# Patient Record
Sex: Male | Born: 1975 | Race: White | Hispanic: No | Marital: Single | Smoking: Current every day smoker
Health system: Southern US, Community
[De-identification: ages and names within clinical notes are randomized; demographics above are authoritative.]

## PROBLEM LIST (undated history)

## (undated) DIAGNOSIS — F32A Depression, unspecified: Secondary | ICD-10-CM

## (undated) DIAGNOSIS — F329 Major depressive disorder, single episode, unspecified: Secondary | ICD-10-CM

## (undated) DIAGNOSIS — F419 Anxiety disorder, unspecified: Secondary | ICD-10-CM

## (undated) HISTORY — PX: APPENDECTOMY: SHX54

---

## 2012-11-25 ENCOUNTER — Encounter (HOSPITAL_COMMUNITY): Payer: Self-pay | Admitting: *Deleted

## 2012-11-25 ENCOUNTER — Inpatient Hospital Stay (HOSPITAL_COMMUNITY)
Admission: AD | Admit: 2012-11-25 | Discharge: 2012-11-26 | DRG: 425 | Disposition: A | Discharge status: Home / Self Care | Payer: BC Managed Care – PPO | Source: Ambulatory Visit | Attending: Psychiatry | Admitting: Psychiatry

## 2012-11-25 ENCOUNTER — Ambulatory Visit (HOSPITAL_COMMUNITY)
Admission: RE | Admit: 2012-11-25 | Discharge: 2012-11-25 | Disposition: A | Discharge status: Home / Self Care | Payer: BC Managed Care – PPO | Attending: Psychiatry | Admitting: Psychiatry

## 2012-11-25 ENCOUNTER — Emergency Department (HOSPITAL_COMMUNITY)
Admission: EM | Admit: 2012-11-25 | Discharge: 2012-11-25 | Disposition: A | Discharge status: Other Acute Inpatient Hospital | Payer: BC Managed Care – PPO | Attending: Emergency Medicine | Admitting: Emergency Medicine

## 2012-11-25 ENCOUNTER — Encounter (HOSPITAL_COMMUNITY): Payer: Self-pay | Admitting: Behavioral Health

## 2012-11-25 DIAGNOSIS — R0789 Other chest pain: Secondary | ICD-10-CM | POA: Insufficient documentation

## 2012-11-25 DIAGNOSIS — R0602 Shortness of breath: Secondary | ICD-10-CM | POA: Insufficient documentation

## 2012-11-25 DIAGNOSIS — Z79899 Other long term (current) drug therapy: Secondary | ICD-10-CM

## 2012-11-25 DIAGNOSIS — F32A Depression, unspecified: Secondary | ICD-10-CM | POA: Diagnosis present

## 2012-11-25 DIAGNOSIS — F3289 Other specified depressive episodes: Secondary | ICD-10-CM | POA: Insufficient documentation

## 2012-11-25 DIAGNOSIS — F411 Generalized anxiety disorder: Secondary | ICD-10-CM | POA: Diagnosis present

## 2012-11-25 DIAGNOSIS — F17200 Nicotine dependence, unspecified, uncomplicated: Secondary | ICD-10-CM | POA: Diagnosis present

## 2012-11-25 DIAGNOSIS — F329 Major depressive disorder, single episode, unspecified: Secondary | ICD-10-CM | POA: Diagnosis present

## 2012-11-25 DIAGNOSIS — R45851 Suicidal ideations: Secondary | ICD-10-CM | POA: Insufficient documentation

## 2012-11-25 DIAGNOSIS — F410 Panic disorder [episodic paroxysmal anxiety] without agoraphobia: Principal | ICD-10-CM | POA: Diagnosis present

## 2012-11-25 DIAGNOSIS — F39 Unspecified mood [affective] disorder: Secondary | ICD-10-CM | POA: Insufficient documentation

## 2012-11-25 DIAGNOSIS — F419 Anxiety disorder, unspecified: Secondary | ICD-10-CM

## 2012-11-25 DIAGNOSIS — Z008 Encounter for other general examination: Secondary | ICD-10-CM | POA: Insufficient documentation

## 2012-11-25 HISTORY — DX: Major depressive disorder, single episode, unspecified: F32.9

## 2012-11-25 HISTORY — DX: Depression, unspecified: F32.A

## 2012-11-25 HISTORY — DX: Anxiety disorder, unspecified: F41.9

## 2012-11-25 LAB — COMPREHENSIVE METABOLIC PANEL
ALT: 23 U/L (ref 0–53)
AST: 24 U/L (ref 0–37)
Albumin: 4.3 g/dL (ref 3.5–5.2)
Calcium: 9.6 mg/dL (ref 8.4–10.5)
Creatinine, Ser: 0.92 mg/dL (ref 0.50–1.35)
Sodium: 140 mEq/L (ref 135–145)

## 2012-11-25 LAB — CBC
MCH: 31.5 pg (ref 26.0–34.0)
MCV: 88.8 fL (ref 78.0–100.0)
Platelets: 408 10*3/uL — ABNORMAL HIGH (ref 150–400)
RBC: 5.02 MIL/uL (ref 4.22–5.81)
RDW: 12.4 % (ref 11.5–15.5)
WBC: 13.2 10*3/uL — ABNORMAL HIGH (ref 4.0–10.5)

## 2012-11-25 LAB — RAPID URINE DRUG SCREEN, HOSP PERFORMED
Amphetamines: NOT DETECTED
Benzodiazepines: POSITIVE — AB
Cocaine: NOT DETECTED
Opiates: NOT DETECTED
Tetrahydrocannabinol: NOT DETECTED

## 2012-11-25 LAB — SALICYLATE LEVEL: Salicylate Lvl: <2 mg/dL — ABNORMAL LOW (ref 2.8–20.0)

## 2012-11-25 MED ORDER — IBUPROFEN 600 MG PO TABS: 600.0000 mg | ORAL_TABLET | Freq: Three times a day (TID) | ORAL | Status: DC | PRN

## 2012-11-25 MED ORDER — NICOTINE 21 MG/24HR TD PT24
21.0000 mg | MEDICATED_PATCH | Freq: Every day | TRANSDERMAL | Status: DC
  Administered 2012-11-26: 21 mg via TRANSDERMAL
  Filled 2012-11-25 (×3): qty 1

## 2012-11-25 MED ORDER — ACETAMINOPHEN 325 MG PO TABS: 650.0000 mg | ORAL_TABLET | Freq: Four times a day (QID) | ORAL | Status: DC | PRN

## 2012-11-25 MED ORDER — ALPRAZOLAM 0.5 MG PO TABS: 0.5000 mg | ORAL_TABLET | Freq: Three times a day (TID) | ORAL | Status: DC | PRN

## 2012-11-25 MED ORDER — FLUOXETINE HCL 20 MG PO CAPS
40.0000 mg | ORAL_CAPSULE | Freq: Every day | ORAL | Status: DC
  Filled 2012-11-25 (×4): qty 2

## 2012-11-25 MED ORDER — ACETAMINOPHEN 325 MG PO TABS: 650.0000 mg | ORAL_TABLET | ORAL | Status: DC | PRN

## 2012-11-25 MED ORDER — LORAZEPAM 1 MG PO TABS: 1.0000 mg | ORAL_TABLET | Freq: Three times a day (TID) | ORAL | Status: DC | PRN

## 2012-11-25 MED ORDER — MAGNESIUM HYDROXIDE 400 MG/5ML PO SUSP: 30.0000 mL | Freq: Every day | ORAL | Status: DC | PRN

## 2012-11-25 MED ORDER — ALUM & MAG HYDROXIDE-SIMETH 200-200-20 MG/5ML PO SUSP: 30.0000 mL | ORAL | Status: DC | PRN

## 2012-11-25 MED ORDER — HYDROXYZINE HCL 50 MG PO TABS
50.0000 mg | ORAL_TABLET | Freq: Every evening | ORAL | Status: DC | PRN
  Administered 2012-11-25: 50 mg via ORAL
  Filled 2012-11-25: qty 8
  Filled 2012-11-25: qty 1

## 2012-11-25 NOTE — ED Notes (Signed)
Patient admitted for panic attacks, on Effexor being weaned off and placed on Prozac, Prozac did not work as the Effexor had and he was having panic attacks even more than 10 years ago, just wants the panic attacks to get less and allow him to feel better, patient pleasant and cooperative

## 2012-11-25 NOTE — Tx Team (Signed)
Initial Interdisciplinary Treatment Plan  PATIENT STRENGTHS: (choose at least two) Ability for insight Active sense of humor Average or above average intelligence Capable of independent living General fund of knowledge Motivation for treatment/growth Supportive family/friends  PATIENT STRESSORS: Health problems Medication change or noncompliance   PROBLEM LIST: Problem List/Patient Goals Date to be addressed Date deferred Reason deferred Estimated date of resolution  Panic attacks      Medication adjustment-was taken off Effexor and started on Prozac, then the panic attacks started.      Risk for self harm      Grief/loss                                     DISCHARGE CRITERIA:  Ability to meet basic life and health needs Improved stabilization in mood, thinking, and/or behavior Motivation to continue treatment in a less acute level of care Verbal commitment to aftercare and medication compliance  PRELIMINARY DISCHARGE PLAN: Outpatient therapy Return to previous living arrangement Return to previous work or school arrangements  PATIENT/FAMIILY INVOLVEMENT: This treatment plan has been presented to and reviewed with the patient, John Estes, and/or family member.  The patient and family have been given the opportunity to ask questions and make suggestions.  Jesus Genera [facility] 11/25/2012, 11:55 PM

## 2012-11-25 NOTE — ED Notes (Signed)
Pt states he went to [facility] today d/t severe anxiety and recently had his medications changed, was taking effexor and got switched to prozac, states he does not think he was taken off effexor properly and the prozac is not working for him. Pt states "I have no had SI thoughts in the last 6 hours but have had SI recently". Denies plan. Pt states "I need help getting my head fixed".
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
## 2012-11-25 NOTE — ED Provider Notes (Signed)
History     CSN: [id number]  Arrival date & time 11/25/12  1506   First MD Initiated Contact with Patient 11/25/12 1610      Chief Complaint  Patient presents with  . Medical Clearance  . Anxiety    (Consider location/radiation/quality/duration/timing/severity/associated sxs/prior treatment) HPI Comments: This is a 36 year old male, who presents to the ED with a chief complaint of anxiety.  Additionally, he endorses SI, but without a plan.  He states that his medications have been changed recently, and that he is having more and more frequent panic attacks.  He states that he has also had more frequent thoughts of ending his life.  He states that this is concerning to him.  He also endorses SOB and chest tightness that come and go with his anxiety.  He has no cardiac history, however he does smoke.  The patient takes Prozac daily, and occasionally takes xanax.  The history is provided by the patient. No language interpreter was used.    Past Medical History  Diagnosis Date  . Anxiety   . Depression     Past Surgical History  Procedure Date  . Appendectomy     History reviewed. No pertinent family history.  History  Substance Use Topics  . Smoking status: Current Every Day Smoker  . Smokeless tobacco: Never Used  . Alcohol Use: No      Review of Systems  All other systems reviewed and are negative.    Allergies  Penicillins  Home Medications   Current Outpatient Rx  Name  Route  Sig  Dispense  Refill  . ALPRAZOLAM 1 MG PO TABS   Oral   Take 0.5 mg by mouth 3 (three) times daily as needed. Anxiety         . FLUOXETINE HCL 40 MG PO CAPS   Oral   Take 40 mg by mouth daily.           BP 116/71  Pulse 69  Temp 98.5 F (36.9 C) (Oral)  Resp 16  SpO2 100%  Physical Exam  Nursing note and vitals reviewed. Constitutional: He is oriented to person, place, and time. He appears well-developed and well-nourished.  HENT:  Head: Normocephalic and  atraumatic.  Right Ear: External ear normal.  Left Ear: External ear normal.  Nose: Nose normal.  Mouth/Throat: Oropharynx is clear and moist. No oropharyngeal exudate.  Eyes: Conjunctivae normal and EOM are normal. Pupils are equal, round, and reactive to light. Right eye exhibits no discharge. Left eye exhibits no discharge. No scleral icterus.  Neck: Normal range of motion. Neck supple. No JVD present.  Cardiovascular: Normal rate, regular rhythm, normal heart sounds and intact distal pulses.  Exam reveals no gallop and no friction rub.   No murmur heard. Pulmonary/Chest: Effort normal and breath sounds normal. No respiratory distress. He has no wheezes. He has no rales. He exhibits no tenderness.  Abdominal: Soft. Bowel sounds are normal. He exhibits no distension and no mass. There is no tenderness. There is no rebound and no guarding.  Musculoskeletal: Normal range of motion. He exhibits no edema and no tenderness.  Neurological: He is alert and oriented to person, place, and time. He has normal reflexes.       CN 3-12 intact  Skin: Skin is warm and dry.  Psychiatric: He has a normal mood and affect. His behavior is normal. Judgment and thought content normal.    ED Course  Procedures (including critical care time)  Labs Reviewed  ACETAMINOPHEN LEVEL  CBC  COMPREHENSIVE METABOLIC PANEL  ETHANOL  SALICYLATE LEVEL  URINE RAPID DRUG SCREEN (HOSP PERFORMED)   No results found.   No diagnosis found. Anxiety SI    MDM  35 year old male with anxiety and SI.  The patient states that he does have some chest discomfort, but it comes and goes with his anxiety. Currently he is not having any discomfort.  The pain is not reproducible, and does not change with exertion.  I believe this to be due to anxiety.  I believe the patient to be medically clear for ACT team evaluation.  I will move the patient to the psych ED, and put in temp. orders.        Roxy Horseman,  PA-C 11/25/12 1640

## 2012-11-25 NOTE — ED Notes (Signed)
Pt was changed into blue scrubs. Security wanded patient. Belongings sent home with girlfriend.

## 2012-11-25 NOTE — [facility] Counselor (Signed)
Received call from [place] (staff in the assessment office at [facility]). Sts that pt was assessed at [facility] and sent from [facility] for medical clearance.

## 2012-11-25 NOTE — ED Notes (Signed)
Bed:[facility]CON<BR> Expected date:<BR> Expected time:<BR> Means of arrival:<BR> Comments:<BR> Pt still in room

## 2012-11-26 ENCOUNTER — Encounter (HOSPITAL_COMMUNITY): Payer: Self-pay | Admitting: Psychiatry

## 2012-11-26 DIAGNOSIS — F411 Generalized anxiety disorder: Secondary | ICD-10-CM

## 2012-11-26 DIAGNOSIS — F419 Anxiety disorder, unspecified: Secondary | ICD-10-CM | POA: Diagnosis present

## 2012-11-26 DIAGNOSIS — F329 Major depressive disorder, single episode, unspecified: Secondary | ICD-10-CM | POA: Diagnosis present

## 2012-11-26 MED ORDER — FLUOXETINE HCL 20 MG PO CAPS
20.0000 mg | ORAL_CAPSULE | Freq: Every day | ORAL | Status: DC
  Administered 2012-11-26: 20 mg via ORAL
  Filled 2012-11-26 (×2): qty 4
  Filled 2012-11-26: qty 1

## 2012-11-26 MED ORDER — FLUOXETINE HCL 20 MG PO CAPS: 20.0000 mg | ORAL_CAPSULE | Freq: Every day | ORAL | Status: DC

## 2012-11-26 MED ORDER — HYDROXYZINE HCL 50 MG PO TABS: 50.0000 mg | ORAL_TABLET | Freq: Every evening | ORAL | Status: DC | PRN

## 2012-11-26 NOTE — Progress Notes (Signed)
Discharge Note: Patient eager for discharge, verbalized discharge plan and current medication. Denied SI/HI. Patient verbalized that he had all of his belongings. He left with prescription and a copy of his AVS.  Joice Lofts RN MS EdS 11/26/2012  12:43 PM
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
## 2012-11-26 NOTE — Progress Notes (Signed)
[facility] INPATIENT:  Family/Significant Other Suicide Prevention Education  Suicide Prevention Education:  Education Completed; Loura Pardon - fiance [phone]),  (name of family member/significant other) has been identified by the patient as the family member/significant other with whom the patient will be residing, and identified as the person(s) who will aid the patient in the event of a mental health crisis (suicidal ideations/suicide attempt).  With written consent from the patient, the family member/significant other has been provided the following suicide prevention education, prior to the and/or following the discharge of the patient.  The suicide prevention education provided includes the following:  Suicide risk factors  Suicide prevention and interventions  National Suicide Hotline telephone number  [facility] assessment telephone number  [facility] Emergency Assistance 911  [facility] and/or Residential Mobile Crisis Unit telephone number  Request made of family/significant other to:  Remove weapons (e.g., guns, rifles, knives), all items previously/currently identified as safety concern.    Remove drugs/medications (over-the-counter, prescriptions, illicit drugs), all items previously/currently identified as a safety concern.  The family member/significant other verbalizes understanding of the suicide prevention education information provided.  The family member/significant other agrees to remove the items of safety concern listed above.  Carmina Miller 11/26/2012, 10:13 AM
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
## 2012-11-26 NOTE — [facility] Suicide Risk Assessment (Signed)
Suicide Risk Assessment  Admission Assessment     Nursing information obtained from:  Patient Demographic factors:  Male;Caucasian;Living alone Current Mental Status:   (Pt denies) Loss Factors:  Decline in physical health Historical Factors:  NA Risk Reduction Factors:  Sense of responsibility to family;Employed;Positive social support;Positive therapeutic relationship  CLINICAL FACTORS:   Severe Anxiety and/or Agitation Panic Attacks  COGNITIVE FEATURES THAT CONTRIBUTE TO RISK: None identified   SUICIDE RISK:   Minimal: No identifiable suicidal ideation.  Patients presenting with no risk factors but with morbid ruminations; may be classified as minimal risk based on the severity of the depressive symptoms  PLAN OF CARE: Supportive approach/coping skills/CBT/reassess medications   John Estes A 11/26/2012, 12:07 PM

## 2012-11-26 NOTE — [facility] Suicide Risk Assessment (Signed)
Suicide Risk Assessment  Discharge Assessment     Demographic Factors:  Male and Caucasian  Mental Status Per Nursing Assessment::   On Admission:   (Pt denies)  Current Mental Status by Physician: in full contact with reality. Denies suicidal ideas, plans or intent. He is wanting to be D/C. He does not feel that being here will help with anxiety, but it is making it worst. Has good support with his fiancee and his sister.    Loss Factors: Loss of significant relationship  Historical Factors: Anniversary of important loss  Risk Reduction Factors:   Sense of responsibility to family, Employed, Living with another person, especially a relative and Positive social support  Continued Clinical Symptoms:  Panic Attacks Depression:   Insomnia  Cognitive Features That Contribute To Risk: None identified     Suicide Risk:  Minimal: No identifiable suicidal ideation.  Patients presenting with no risk factors but with morbid ruminations; may be classified as minimal risk based on the severity of the depressive symptoms  Discharge Diagnoses:   AXIS I:  Anxiety Disorder NOS (Panic, GAD) Depressive Disorder NOS AXIS II:  Deferred AXIS III:   Past Medical History  Diagnosis Date  . Anxiety   . Depression    AXIS IV:  None identified AXIS V:  61-70 mild symptoms  Plan Of Care/Follow-up recommendations:  Activity:  As tolerated Diet:  Regular Will follow up with Dr. Evelene Croon and will come to the [facility] IOP  Will pursue Prozac at 20 mg daily Is patient on multiple antipsychotic therapies at discharge:  No   Has Patient had three or more failed trials of antipsychotic monotherapy by history:  No  Recommended Plan for Multiple Antipsychotic Therapies: N/A   Lorieann Argueta A 11/26/2012, 12:18 PM

## 2012-11-26 NOTE — ED Provider Notes (Signed)
Medical screening examination/treatment/procedure(s) were performed by non-physician practitioner and as supervising physician I was immediately available for consultation/collaboration.  Azelie Noguera, MD 11/26/12 0025 

## 2012-11-26 NOTE — Progress Notes (Signed)
[facility] Adult Case Management Discharge Plan :  Will you be returning to the same living situation after discharge: Yes,  returning home At discharge, do you have transportation home?:Yes,  fiance to pick pt up Do you have the ability to pay for your medications:Yes,  access to meds  Release of information consent forms completed and in the chart;  Patient's signature needed at discharge.  Patient to Follow up at: Follow-up Information    Follow up with [facility] - IOP. On 12/02/2012. (Arrive at 8:45 am to start IOP on this date)    Contact information:   [address] [place], [place] [address] [phone]         Patient denies SI/HI:   Yes,  denies SI/HI    Safety Planning and Suicide Prevention discussed:  Yes,  discussed with pt and fiance  No recommendations from CSW.  No further needs voiced by pt.  Pt stable to discharge.    Carmina Miller 11/26/2012, 10:14 AM

## 2012-11-26 NOTE — Clinical Social Work Note (Signed)
[facility] LCSW Aftercare Discharge Planning Group Note  11/26/2012 8:45 AM  Participation Quality:  Did Not Attend  John Estes, LCSWA 11/26/2012 10:54 AM   

## 2012-11-28 NOTE — Progress Notes (Signed)
Patient Discharge Instructions:  Next Level Care Provider Has Access to the EMR, 11/27/12 Records provided to [facility] via [facility]/ Epic Access  Jerelene Redden, 11/28/2012, 2:07 PM

## 2012-12-02 ENCOUNTER — Other Ambulatory Visit (HOSPITAL_COMMUNITY): Payer: BC Managed Care – PPO | Attending: Psychiatry | Admitting: Psychiatry

## 2012-12-02 ENCOUNTER — Encounter (HOSPITAL_COMMUNITY): Payer: Self-pay

## 2012-12-02 DIAGNOSIS — F3289 Other specified depressive episodes: Secondary | ICD-10-CM | POA: Insufficient documentation

## 2012-12-02 DIAGNOSIS — F411 Generalized anxiety disorder: Secondary | ICD-10-CM | POA: Insufficient documentation

## 2012-12-02 DIAGNOSIS — F419 Anxiety disorder, unspecified: Secondary | ICD-10-CM

## 2012-12-02 DIAGNOSIS — F329 Major depressive disorder, single episode, unspecified: Secondary | ICD-10-CM

## 2012-12-02 NOTE — Progress Notes (Signed)
    Daily Group Progress Note  Program: IOP  Group Time: 9:00-10:30 am  Participation Level: None  Behavioral Response: none  Type of Therapy:  Process Group  Summary of Progress: Patient was not present for this group due to completing initial orientation and assessment for his first day in the group.      Group Time: 10:30 am - 12:00 pm   Participation Level:  Active  Behavioral Response: Appropriate  Type of Therapy: Psycho-education Group  Summary of Progress: Patient participated in a group on Grief and Loss that was facilitated by Theda Belfast and identified current losses and affective ways of grieving.   Carman Ching, LCSW
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
## 2012-12-03 ENCOUNTER — Encounter (HOSPITAL_COMMUNITY): Payer: BC Managed Care – PPO | Attending: Psychiatry | Admitting: Psychiatry

## 2012-12-03 DIAGNOSIS — F419 Anxiety disorder, unspecified: Secondary | ICD-10-CM

## 2012-12-03 DIAGNOSIS — F411 Generalized anxiety disorder: Secondary | ICD-10-CM | POA: Insufficient documentation

## 2012-12-03 DIAGNOSIS — F329 Major depressive disorder, single episode, unspecified: Secondary | ICD-10-CM | POA: Insufficient documentation

## 2012-12-03 DIAGNOSIS — F3289 Other specified depressive episodes: Secondary | ICD-10-CM | POA: Insufficient documentation

## 2012-12-03 NOTE — Discharge Summary (Signed)
Physician Discharge Summary Note  Patient:  John Estes is an 37 y.o., male MRN:  [id number] DOB:  1975/12/07 Patient phone:  [phone] (home)  Patient address:   [address] [place] [address],   Date of Admission:  11/25/2012 Date of Discharge: 11/26/2012  Reason for Admission: 37 Y/O male, single who endorsed a long standing history of anxiety, panic attacks, and some underlying depression. Had initial trials with Paxil, Lexapro, and finally Effexor what he took sucesfully for seven years. His father go sick with cancer, and eventually died this was 10-09-11. For the last couple of months he felt that the Effexor had quit working for him. He was experiencing increased anxiety. Coming 10/09/2023, the medication was changed. At the same time he was dealing with the anniversary of father's death and some other stressors. Coming off Effexor was very difficult. He was placed on Prozac 40 mg. His panic attacks became worst. He was trying not to use xanax as did not wanted to get addicted. He started getting more frustrated that he had to still deal with panic after all these years. He started feeling he would rather be dead. No clear suicidal ideas. In the past he felt he needed to come to a hospital when feeling anxious. His father was the main source of support. He talked him through. This time around he did not have his father what made the grieving worst. He admitted he had not proper ly grieved the death of his father. He was brought to the ED.  Discharge Diagnoses: Active Problems:  Anxiety disorder  Depressive disorder  Review of Systems  Constitutional: Negative.   HENT: Negative.   Eyes: Negative.   Respiratory: Negative.   Cardiovascular: Negative.   Gastrointestinal: Negative.   Genitourinary: Negative.   Musculoskeletal: Negative.   Skin: Negative.   Neurological: Negative.   Endo/Heme/Allergies: Negative.   Psychiatric/Behavioral: Positive for  depression. The patient is nervous/anxious.    Axis Diagnosis:   AXIS I:  Panic Attack Disorder, Generalized Anxiety Disorder, Depressive Disorder NOS AXIS II:  Deferred AXIS III:   Past Medical History  Diagnosis Date  . Anxiety   . Depression    AXIS IV:  None identified AXIS V:  51-60 moderate symptoms  Level of Care:  IOP  Hospital Course:  He was admitted and started in individual and group therapy. The Prozac was decreased to 20 mg thinking that the higher dose could have be contributing to the increased panic. He was placed back on the Xanax and encouraged to use it to control the acute panic and to prevent the development of agoraphobia.He felt much better. He felt reassured as his father used to reassure him when he was having the panic. He felt that the inpatient setting was not going to help him deal with the panic and could make things worst. He had the support of his girlfriend and his sister. They felt comfortable of him being discharged to outpatient treatment  Consults:  None  Significant Diagnostic Studies:  labs: as per the chart  Discharge Vitals:   Blood pressure 111/76, pulse 73, temperature 97.5 F (36.4 C), temperature source Oral, resp. rate 16, height 5' 9.5" (1.765 m), weight 78.019 kg (172 lb). Body mass index is 25.04 kg/(m^2). Lab Results:   No results found for this or any previous visit (from the past 72 hour(s)).  Physical Findings: AIMS: Facial and Oral Movements Muscles of Facial Expression: None, normal Lips and Perioral Area: None,  normal Jaw: None, normal Tongue: None, normal,Extremity Movements Upper (arms, wrists, hands, fingers): None, normal Lower (legs, knees, ankles, toes): None, normal, Trunk Movements Neck, shoulders, hips: None, normal, Overall Severity Severity of abnormal movements (highest score from questions above): None, normal Incapacitation due to abnormal movements: None, normal Patient's awareness of abnormal movements  (rate only patient's report): No Awareness, Dental Status Current problems with teeth and/or dentures?: No Does patient usually wear dentures?: No  CIWA:    COWS:     Psychiatric Specialty Exam: See Psychiatric Specialty Exam and Suicide Risk Assessment completed by Attending Physician prior to discharge.  Discharge destination:  Home  Is patient on multiple antipsychotic therapies at discharge:  No   Has Patient had three or more failed trials of antipsychotic monotherapy by history:  No  Recommended Plan for Multiple Antipsychotic Therapies: N/A      Medication List     As of 12/03/2012  8:15 AM    STOP taking these medications         ALPRAZolam 1 MG tablet   Commonly known as: XANAX      TAKE these medications      Indication    FLUoxetine 20 MG capsule   Commonly known as: PROZAC   Take 1 capsule (20 mg total) by mouth daily. For depression    Indication: Depression      hydrOXYzine 50 MG tablet   Commonly known as: ATARAX/VISTARIL   Take 1 tablet (50 mg total) by mouth at bedtime as needed and may repeat dose one time if needed (insomnia). For anxiety and sleep            Follow-up Information    Follow up with [facility] - IOP. On 12/02/2012. (Arrive at 8:45 am to start IOP on this date)    Contact information:   [address] [place], [place] [address] [phone]         Follow-up recommendations:  Will follow up at [facility] health intensive outpatient program (IOP). Will continue the Prozac 20 mg and use the Xanax 0.5 to 1 mg BID PRN anxiety  Comments:  Motivated to pursue outpatient treatment  Total Discharge Time:  More than 30 min  Signed: Dontavius Keim A 12/03/2012, 8:15 AM

## 2012-12-03 NOTE — Progress Notes (Signed)
    Daily Group Progress Note  Program: IOP  Group Time: 9:00-10:30 am   Participation Level: Active  Behavioral Response: Appropriate  Type of Therapy:  Process Group  Summary of Progress: Patient was active in discussions and described a long history of battling depression and panic attacks without having affective coping strategies. He described the role his dad fulfilled in helping him manage his panic attacks and how he does not have that anymore after his fathers death. He hopes to learn new ways of managing depression and anxiety.      Group Time: 10:30 am - 12:00 pm   Participation Level:  Active  Behavioral Response: Appropriate  Type of Therapy: Psycho-education Group  Summary of Progress: Patient learned about the symptoms of depression and anxiety and how to recognize them to avoid or reduce future relapses.  Carman Ching, LCSW

## 2012-12-04 ENCOUNTER — Encounter (HOSPITAL_COMMUNITY): Payer: BC Managed Care – PPO | Admitting: Psychiatry

## 2012-12-04 DIAGNOSIS — F329 Major depressive disorder, single episode, unspecified: Secondary | ICD-10-CM

## 2012-12-04 DIAGNOSIS — F419 Anxiety disorder, unspecified: Secondary | ICD-10-CM

## 2012-12-04 NOTE — Progress Notes (Signed)
    Daily Group Progress Note  Program: IOP  Group Time: 9:00-10:30 am   Participation Level: Active  Behavioral Response: Appropriate  Type of Therapy:  Process Group  Summary of Progress: Patient reports feeling "anxious" but "appreciate" of this group experience. He talked about how he feels he has pushed away healthy friendships by not receiving support and help from them when they were offering it. He struggles with trying to manage his depression on his own and is realizing he needs help. He states he has had a reduction in his depression symptoms through the support of the group and he is learning how to be transparent and express his feelings honestly.      Group Time: 10:30 am - 12:00 pm   Participation Level:  Active  Behavioral Response: Appropriate  Type of Therapy: Psycho-education Group  Summary of Progress: Patient learned about mental health support groups for continuing support following ending the program.   Carman Ching, LCSW

## 2012-12-05 ENCOUNTER — Encounter (HOSPITAL_COMMUNITY): Payer: BC Managed Care – PPO | Admitting: Psychiatry

## 2012-12-05 DIAGNOSIS — F32A Depression, unspecified: Secondary | ICD-10-CM

## 2012-12-05 DIAGNOSIS — F329 Major depressive disorder, single episode, unspecified: Secondary | ICD-10-CM

## 2012-12-05 DIAGNOSIS — F419 Anxiety disorder, unspecified: Secondary | ICD-10-CM

## 2012-12-05 NOTE — Progress Notes (Signed)
    Daily Group Progress Note  Program: IOP  Group Time: 9:00-10:30 am   Participation Level: Active  Behavioral Response: Appropriate  Type of Therapy:  Process Group  Summary of Progress: patient reports feeling "good" today and said he feels he is making progress with his depression. He explored his tendency to help others and how this causes him to avoid managing his own depression symptoms because is is busy trying to rescue others.      Group Time: 10:30 am - 12:00 pm   Participation Level:  Active  Behavioral Response: Appropriate  Type of Therapy: Psycho-education Group  Summary of Progress: Patient participated in two goodbye ceremonies to members ending the group today and practiced the skill of having healthy closure.   Carman Ching, LCSW

## 2012-12-06 ENCOUNTER — Encounter (HOSPITAL_BASED_OUTPATIENT_CLINIC_OR_DEPARTMENT_OTHER): Payer: BC Managed Care – PPO | Admitting: Psychiatry

## 2012-12-06 DIAGNOSIS — F411 Generalized anxiety disorder: Secondary | ICD-10-CM

## 2012-12-06 DIAGNOSIS — F419 Anxiety disorder, unspecified: Secondary | ICD-10-CM

## 2012-12-06 DIAGNOSIS — F329 Major depressive disorder, single episode, unspecified: Secondary | ICD-10-CM

## 2012-12-06 NOTE — Patient Instructions (Addendum)
Patient completed MH-IOP today.  Will follow up with Dr. Evelene Croon on 12-23-12 @ 4:30pm.  Patient will contact Sherron Monday, [facility] for a new patient appointment.  Encouraged support groups.  Return back to work (without any restrictions) on 12-09-12.

## 2012-12-06 NOTE — Progress Notes (Signed)
Patient ID: John Estes, male   DOB: [birth year], 37 y.o.   MRN: [id number] This is a discharge on this patient  whohas been at this IOP program for approximately one week. He's done very well. He was initially admitted to the inpatient unit for about a day or 2 because of the tense anxiety and some suicidal ideation. Interview today he denied being suicidal at this timel. He was believe that his anxiety was related to some grieving responses to his father who died October 18, 2011. It also may be related to withdrawal phenomena from Effexor which he was just tapered off of. He was however on 40 mg of Prozac nonetheless he seemed to have uncontrollable anxiety. Now he is on 20 mg of Prozac and takes Xanax 1 mg when necessary. He is now off all Effexor. He says his anxiety is dramatically improved. He does not feel depressed. He claims he sleeps and eats well. He is not suicidal. His thoughts are clear organizing connectived He felt he made a good connection over the week in the IOP program and did allow himself to be open. The patient feels ready to be discharged and plans to spend a day caring for his sister who is not well. Overall the patient has shown a distinct improvement in being able to talk and explore some feelings in a group setting. He's had no anxiety attacks.  Mental status exam This patient is alert and oriented. He is able to focus and concentrate. He denies anxiety or depression. There is no evidence of psychosis now or ever. The patient is low longer suicidal. Is not having acute attacks of anxiety for over a week.    Plan  At this time the patient will continue taking Prozac 20 mg every morning and will take Xanax 1 mg when necessary. The patient is very cautious about misusing the Xanax. He looks were to return in the care of his psychiatrist and to start in therapy working on his trauma from the death of his father.

## 2012-12-06 NOTE — Progress Notes (Signed)
Patient ID: John Estes, male   DOB: [birth year], 37 y.o.   MRN: [id number] D:  This is a 37 yo caucasian male who was referred per Dr. Dub Mikes. He has a long standing history since he was age 10. 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 He experiences panic attacks. More like a tightness in the chest, shooting pain on the left side, ( felt like a panic attack). Hx of SI, currently denies. Also denies any HI or A/V hallucinations. He was initially on Paxil then Lexapro and finally Effexor for 7 years. Did well on Effexor. It seemed to stop working. In 2013-11-19he started going off. He experienced withdrawal from Effexor he was placed on Prozac. Stressors: 1)Father died 11-19-12due to cancer. Melanoma metastatic to liver and brain. A year later started feeling more anxiety. Feels he never dealt with his fathers death. Medication gave him 8 more weeks. He stayed with his father for the last 5 months of his life. He was his caretaker. He was there when he died. His father was married 4 times. His father help him through his anxiety, he was wanting to be there for him. 2)History of eating disorder since age 7. 3) Strained relationship with mother.  Pt requested that today be his last day, because he has to return to work on 12-09-12.  Pt c/o having difficulty waking up.  Denies any SI/HI or A/V hallucinations.  States he would like to continue working on self care.  A:  D/C today.  F/U with Dr. Evelene Croon on 12-23-12 @ 4:30pm.  Even though Fred May, [facility] can't see pt until the first week in February, pt is still requesting to see him.  Will inform Fred May, LPC.  RTW on 12-09-12, without any restrictions.  R:  Pt receptive.

## 2012-12-06 NOTE — Progress Notes (Signed)
    Daily Group Progress Note  Program: IOP  Group Time: 9:00-10:30 am   Participation Level: Active  Behavioral Response: Appropriate  Type of Therapy:  Process Group  Summary of Progress: Today is patients last day in the group. He reports feeling more hopeful and less depressed than when he started and identified how his tendency to try to rescue and help others took away from him having his needs met.      Group Time: 10:30 am - 12:00 pm   Participation Level:  Active  Behavioral Response: Appropriate  Type of Therapy: Psycho-education Group  Summary of Progress: Patient learned about the importance of planning for daily wellness by creating a daily wellness routine list and identifying self-care items to use in addition to this list when stress increases.  Carman Ching, LCSW

## 2012-12-09 ENCOUNTER — Encounter (HOSPITAL_COMMUNITY): Payer: BC Managed Care – PPO

## 2012-12-10 ENCOUNTER — Encounter (HOSPITAL_COMMUNITY): Payer: BC Managed Care – PPO

## 2012-12-11 ENCOUNTER — Encounter (HOSPITAL_COMMUNITY): Payer: BC Managed Care – PPO

## 2012-12-12 ENCOUNTER — Encounter (HOSPITAL_COMMUNITY): Payer: BC Managed Care – PPO

## 2012-12-13 ENCOUNTER — Encounter (HOSPITAL_COMMUNITY): Payer: BC Managed Care – PPO

## 2012-12-16 ENCOUNTER — Encounter (HOSPITAL_COMMUNITY): Payer: BC Managed Care – PPO

## 2012-12-17 ENCOUNTER — Encounter (HOSPITAL_COMMUNITY): Payer: BC Managed Care – PPO

## 2012-12-18 ENCOUNTER — Encounter (HOSPITAL_COMMUNITY): Payer: BC Managed Care – PPO

## 2012-12-19 ENCOUNTER — Encounter (HOSPITAL_COMMUNITY): Payer: BC Managed Care – PPO

## 2012-12-20 ENCOUNTER — Encounter (HOSPITAL_COMMUNITY): Payer: BC Managed Care – PPO

## 2012-12-23 ENCOUNTER — Encounter (HOSPITAL_COMMUNITY): Payer: BC Managed Care – PPO

## 2012-12-24 ENCOUNTER — Encounter (HOSPITAL_COMMUNITY): Payer: BC Managed Care – PPO

## 2012-12-25 ENCOUNTER — Encounter (HOSPITAL_COMMUNITY): Payer: BC Managed Care – PPO

## 2012-12-26 ENCOUNTER — Encounter (HOSPITAL_COMMUNITY): Payer: BC Managed Care – PPO

## 2012-12-27 ENCOUNTER — Encounter (HOSPITAL_COMMUNITY): Payer: BC Managed Care – PPO

## 2012-12-30 ENCOUNTER — Encounter (HOSPITAL_COMMUNITY): Payer: BC Managed Care – PPO

## 2017-06-19 ENCOUNTER — Encounter (HOSPITAL_COMMUNITY): Payer: Self-pay | Admitting: Emergency Medicine

## 2017-06-19 ENCOUNTER — Emergency Department (HOSPITAL_COMMUNITY)
Admission: EM | Admit: 2017-06-19 | Discharge: 2017-06-19 | Disposition: A | Discharge status: Home / Self Care | Payer: 59 | Attending: Emergency Medicine | Admitting: Emergency Medicine

## 2017-06-19 DIAGNOSIS — F419 Anxiety disorder, unspecified: Secondary | ICD-10-CM

## 2017-06-19 DIAGNOSIS — F17210 Nicotine dependence, cigarettes, uncomplicated: Secondary | ICD-10-CM | POA: Insufficient documentation

## 2017-06-19 LAB — I-STAT TROPONIN, ED: TROPONIN I, POC: 0 ng/mL (ref 0.00–0.08)

## 2017-06-19 MED ORDER — LORAZEPAM 1 MG PO TABS
1.0000 mg | ORAL_TABLET | Freq: Once | ORAL | Status: AC
  Administered 2017-06-19: 1 mg via ORAL
  Filled 2017-06-19: qty 1

## 2017-06-19 NOTE — ED Triage Notes (Addendum)
Pt complaint of anxiety attack Saturday, Sunday, and again today. [facility] "they said it was acid reflux and kicked me out the door." Pt continues to verbalize "I know it's my anxiety; it is not a heart problem." Anxiety unrelieved by home medications.

## 2017-06-19 NOTE — ED Provider Notes (Signed)
[facility]-EMERGENCY DEPT Provider Note   CSN: [id number] Arrival date & time: 06/19/17  1724     History   Chief Complaint Chief Complaint  Patient presents with  . Anxiety    HPI John Estes is a 41 y.o. male.  HPI  Patient, with a past medical history of anxiety and depression, presents to the ED for several episodes of anxiety attacks over the past 3 days. He states that he is in the emergency room earlier today and was discharged with diagnosis of heartburn. He states that he has been having midepigastric pain that he relates to his anxiety. He reports similar symptoms during previous anxiety attacks. He reports 1.5 mg of his home Xanax has improved symptoms mildly. He denies any SI, HI, auditory or visual hallucinations. He denies any drug use or medication overdose.  Past Medical History:  Diagnosis Date  . Anxiety   . Depression     Patient Active Problem List   Diagnosis Date Noted  . Anxiety disorder 11/26/2012  . Depressive disorder 11/26/2012    Past Surgical History:  Procedure Laterality Date  . APPENDECTOMY         Home Medications    Prior to Admission medications   Medication Sig Start Date End Date Taking? Authorizing Provider  ALPRAZolam Prudy Feeler) 1 MG tablet Take 1-2 tablets by mouth 2 (two) times daily as needed for anxiety.  05/18/17  Yes [provider]  sertraline (ZOLOFT) 100 MG tablet Take 1 tablet by mouth daily. 05/28/17  Yes [provider]  FLUoxetine (PROZAC) 20 MG capsule Take 1 capsule (20 mg total) by mouth daily. For depression Patient not taking: Reported on 06/19/2017 11/26/12   Armandina Stammer I, NP  hydrOXYzine (ATARAX/VISTARIL) 50 MG tablet Take 1 tablet (50 mg total) by mouth at bedtime as needed and may repeat dose one time if needed (insomnia). For anxiety and sleep Patient not taking: Reported on 06/19/2017 11/26/12   Sanjuana Kava, NP    Family History Family History  Problem Relation Age of Onset  . Depression  Sister   . Alcohol abuse Maternal Grandfather     Social History Social History  Substance Use Topics  . Smoking status: Current Every Day Smoker    Packs/day: 1.00    Types: Cigarettes  . Smokeless tobacco: Never Used  . Alcohol use No     Allergies   Penicillins   Review of Systems Review of Systems  Constitutional: Negative for appetite change, chills and fever.  HENT: Negative for ear pain, rhinorrhea, sneezing and sore throat.   Eyes: Negative for photophobia and visual disturbance.  Respiratory: Negative for cough, chest tightness, shortness of breath and wheezing.   Cardiovascular: Positive for chest pain. Negative for palpitations.  Gastrointestinal: Negative for abdominal pain, blood in stool, constipation, diarrhea, nausea and vomiting.  Genitourinary: Negative for dysuria, hematuria and urgency.  Musculoskeletal: Negative for myalgias.  Skin: Negative for rash.  Neurological: Negative for dizziness, weakness and light-headedness.  Psychiatric/Behavioral: Negative for suicidal ideas. The patient is nervous/anxious.      Physical Exam Updated Vital Signs BP (!) 143/86 (BP Location: Right Arm)   Pulse (!) 57   Temp 98.1 F (36.7 C) (Oral)   Resp 20   Ht 5\' 10"  (1.778 m)   Wt 99.8 kg (220 lb)   SpO2 95%   BMI 31.57 kg/m   Physical Exam  Constitutional: He appears well-developed and well-nourished. No distress.  HENT:  Head: Normocephalic and atraumatic.  Nose:  Nose normal.  Eyes: Conjunctivae and EOM are normal. Left eye exhibits no discharge. No scleral icterus.  Neck: Normal range of motion. Neck supple.  Cardiovascular: Normal rate, regular rhythm, normal heart sounds and intact distal pulses.  Exam reveals no gallop and no friction rub.   No murmur heard. Pulmonary/Chest: Effort normal and breath sounds normal. No respiratory distress.  Abdominal: Soft. Bowel sounds are normal. He exhibits no distension. There is no tenderness. There is no  guarding.  Musculoskeletal: Normal range of motion. He exhibits no edema.  Neurological: He is alert. He exhibits normal muscle tone. Coordination normal.  Skin: Skin is warm and dry. No rash noted.  Psychiatric: He has a normal mood and affect.  Nursing note and vitals reviewed.    ED Treatments / Results  Labs (all labs ordered are listed, but only abnormal results are displayed) Labs Reviewed  I-STAT TROPONIN, ED    EKG  EKG Interpretation None       Radiology No results found.  Procedures Procedures (including critical care time)  Medications Ordered in ED Medications  LORazepam (ATIVAN) tablet 1 mg (1 mg Oral Given 06/19/17 2053)     Initial Impression / Assessment and Plan / ED Course  I have reviewed the triage vital signs and the nursing notes.  Pertinent labs & imaging results that were available during my care of the patient were reviewed by me and considered in my medical decision making (see chart for details).     Patient presents to ED for evaluation of anxiety for the past 3 days. He states that he has been having midsternal/epigastric chest pain that comes and goes due to his anxiety. He states that this feels similar to previous anxiety attacks. He is unsure if any life or family changes in his household is what's causing the anxiety. He was seen at the ED earlier today and was given negative cardiac workup, and was told he had heartburn. States he had chest x-ray, EKG and labs done.He states that despite the use of his 1.5 mg of Xanax he is continuing to have symptoms. He denies any SI, HI, AH, VH. He denies any medication overdose or access to firearms. On physical exam there are no focal findings on my exam. Troponin negative 1 here. EKG unremarkable. He is not in respiratory distress and is satting at 95% on room air. Heart rate normal. Patient given Ativan here in the ED with improvement in his symptoms. He states that he has had improvement in his  anxiety symptoms. He states that he will follow-up with his outpatient clinic for further evaluation. He states normal follow-up with them every few months. Patient appears stable for discharge at this time. Strict return precautions given.  Final Clinical Impressions(s) / ED Diagnoses   Final diagnoses:  Anxiety    New Prescriptions Discharge Medication List as of 06/19/2017  9:41 PM       Dietrich PatesKhatri, Ranger Petrich, PA-C 06/19/17 2200    Tegeler, Canary Brimhristopher J, MD 06/20/17 [phone]

## 2017-06-19 NOTE — Discharge Instructions (Signed)
Take home medications as previously prescribed. All up with outpatient clinic as needed. Return to ED for worsening chest pain, trouble breathing, coughing up blood, numbness or weakness.

## 2017-06-20 ENCOUNTER — Encounter (HOSPITAL_COMMUNITY): Payer: Self-pay | Admitting: *Deleted

## 2017-06-20 ENCOUNTER — Emergency Department (HOSPITAL_COMMUNITY): Payer: 59

## 2017-06-20 ENCOUNTER — Emergency Department (HOSPITAL_COMMUNITY)
Admission: EM | Admit: 2017-06-20 | Discharge: 2017-06-20 | Disposition: A | Discharge status: Home / Self Care | Payer: 59 | Source: Home / Self Care | Attending: Emergency Medicine | Admitting: Emergency Medicine

## 2017-06-20 ENCOUNTER — Emergency Department (HOSPITAL_COMMUNITY)
Admission: EM | Admit: 2017-06-20 | Discharge: 2017-06-20 | Disposition: A | Discharge status: Home / Self Care | Payer: 59 | Attending: Physician Assistant | Admitting: Physician Assistant

## 2017-06-20 DIAGNOSIS — Z79899 Other long term (current) drug therapy: Secondary | ICD-10-CM

## 2017-06-20 DIAGNOSIS — F17210 Nicotine dependence, cigarettes, uncomplicated: Secondary | ICD-10-CM | POA: Insufficient documentation

## 2017-06-20 DIAGNOSIS — R0789 Other chest pain: Secondary | ICD-10-CM

## 2017-06-20 DIAGNOSIS — R079 Chest pain, unspecified: Secondary | ICD-10-CM | POA: Diagnosis present

## 2017-06-20 DIAGNOSIS — Z720 Tobacco use: Secondary | ICD-10-CM | POA: Insufficient documentation

## 2017-06-20 DIAGNOSIS — F410 Panic disorder [episodic paroxysmal anxiety] without agoraphobia: Secondary | ICD-10-CM | POA: Insufficient documentation

## 2017-06-20 DIAGNOSIS — F329 Major depressive disorder, single episode, unspecified: Secondary | ICD-10-CM | POA: Insufficient documentation

## 2017-06-20 DIAGNOSIS — R200 Anesthesia of skin: Secondary | ICD-10-CM | POA: Insufficient documentation

## 2017-06-20 LAB — RAPID URINE DRUG SCREEN, HOSP PERFORMED
Amphetamines: NOT DETECTED
Barbiturates: NOT DETECTED
Benzodiazepines: POSITIVE — AB
COCAINE: NOT DETECTED
OPIATES: NOT DETECTED
Tetrahydrocannabinol: NOT DETECTED

## 2017-06-20 LAB — COMPREHENSIVE METABOLIC PANEL
ALT: 69 U/L — ABNORMAL HIGH (ref 17–63)
ANION GAP: 13 (ref 5–15)
AST: 47 U/L — ABNORMAL HIGH (ref 15–41)
Albumin: 4.4 g/dL (ref 3.5–5.0)
Alkaline Phosphatase: 71 U/L (ref 38–126)
BUN: 10 mg/dL (ref 6–20)
CALCIUM: 9.5 mg/dL (ref 8.9–10.3)
CO2: 21 mmol/L — AB (ref 22–32)
Chloride: 105 mmol/L (ref 101–111)
Creatinine, Ser: 1.17 mg/dL (ref 0.61–1.24)
Glucose, Bld: 95 mg/dL (ref 65–99)
Potassium: 4.1 mmol/L (ref 3.5–5.1)
SODIUM: 139 mmol/L (ref 135–145)
TOTAL PROTEIN: 7.6 g/dL (ref 6.5–8.1)
Total Bilirubin: 1.1 mg/dL (ref 0.3–1.2)

## 2017-06-20 LAB — CBC
HCT: 48.4 % (ref 39.0–52.0)
HEMATOCRIT: 47.2 % (ref 39.0–52.0)
HEMOGLOBIN: 16.7 g/dL (ref 13.0–17.0)
Hemoglobin: 16.8 g/dL (ref 13.0–17.0)
MCH: 31.7 pg (ref 26.0–34.0)
MCH: 32.6 pg (ref 26.0–34.0)
MCHC: 34.5 g/dL (ref 30.0–36.0)
MCHC: 35.6 g/dL (ref 30.0–36.0)
MCV: 91.8 fL (ref 78.0–100.0)
MCV: 91.7 fL (ref 78.0–100.0)
PLATELETS: 286 10*3/uL (ref 150–400)
PLATELETS: 281 10*3/uL (ref 150–400)
RBC: 5.27 MIL/uL (ref 4.22–5.81)
RBC: 5.15 MIL/uL (ref 4.22–5.81)
RDW: 12.5 % (ref 11.5–15.5)
RDW: 12.4 % (ref 11.5–15.5)
WBC: 13.7 10*3/uL — ABNORMAL HIGH (ref 4.0–10.5)
WBC: 11.4 10*3/uL — ABNORMAL HIGH (ref 4.0–10.5)

## 2017-06-20 LAB — SALICYLATE LEVEL

## 2017-06-20 LAB — BASIC METABOLIC PANEL
ANION GAP: 12 (ref 5–15)
BUN: 12 mg/dL (ref 6–20)
CALCIUM: 9.7 mg/dL (ref 8.9–10.3)
CO2: 23 mmol/L (ref 22–32)
Chloride: 103 mmol/L (ref 101–111)
Creatinine, Ser: 1.15 mg/dL (ref 0.61–1.24)
GFR calc non Af Amer: >60 mL/min (ref >60)
GLUCOSE: 79 mg/dL (ref 65–99)
POTASSIUM: 4 mmol/L (ref 3.5–5.1)
Sodium: 138 mmol/L (ref 135–145)

## 2017-06-20 LAB — ACETAMINOPHEN LEVEL

## 2017-06-20 LAB — ETHANOL

## 2017-06-20 LAB — I-STAT TROPONIN, ED: Troponin i, poc: 0 ng/mL (ref 0.00–0.08)

## 2017-06-20 MED ORDER — ACETAMINOPHEN 325 MG PO TABS: 650.0000 mg | ORAL_TABLET | Freq: Once | ORAL | Status: DC

## 2017-06-20 MED ORDER — HYDROXYZINE HCL 50 MG PO TABS: 50.0000 mg | ORAL_TABLET | Freq: Every evening | ORAL | 0 refills | Status: DC | PRN

## 2017-06-20 NOTE — BH Assessment (Signed)
Tele Assessment Note   John Estes is an 41 y.o. male. Pt reports severe anxiety and panic attacks. Pt states he is seen by a psychiatrist every 6 months for medication. The Pt is prescribed Zoloft and Xanax. Pt does not feel his medication is effective at this time. The Pt does not have a therapist at this time. Pt [facility]. Pt denies current stressors or triggers for his anxiety and panic attacks. Pt is reporting multiple panic attacks. Pt denies SA and abuse.  Per Jacki ConesLaurie, NP Pt does not meet inpatient criteria. TTS provided outpatient resources.   Diagnosis:  F41.0 Panic disorder  Past Medical History:  Past Medical History:  Diagnosis Date  . Anxiety   . Depression     Past Surgical History:  Procedure Laterality Date  . APPENDECTOMY      Family History:  Family History  Problem Relation Age of Onset  . Depression Sister   . Alcohol abuse Maternal Grandfather     Social History:  reports that he has been smoking Cigarettes.  He has been smoking about 1.00 pack per day. He has never used smokeless tobacco. He reports that he does not drink alcohol or use drugs.  Additional Social History:  Alcohol / Drug Use Pain Medications: please see mar Prescriptions: please see mar Over the Counter: please see mar  CIWA: CIWA-Ar BP: (!) 150/101 Pulse Rate: 71 COWS:    PATIENT STRENGTHS: (choose at least two) Average or above average intelligence Communication skills  Allergies:  Allergies  Allergen Reactions  . Penicillins Anaphylaxis    Home Medications:  (Not in a hospital admission)  OB/GYN Status:  No LMP for male patient.  General Assessment Data [facility] T[facility] Assessment: In system Is this a Tele or Face-to-Face Assessment?: Tele Assessment Is this an Initial Assessment or a Re-assessment for this encounter?: Initial Assessment Marital status: Single Maiden name: NA Is patient pregnant?:  No Pregnancy Status: No Living Arrangements: Spouse/significant other Can pt return to current living arrangement?: Yes Admission Status: Voluntary Is patient capable of signing voluntary admission?: Yes Referral Source: Self/Family/Friend Insurance type: united     Crisis Care Plan Living Arrangements: Spouse/significant other Legal Guardian: Other: (self) Name of Psychiatrist: NA Name of Therapist: NA  Education Status Is patient currently in school?: No Current Grade: NA Highest grade of school patient has completed: some college Name of school: NA Contact person: NA  Risk to self with the past 6 months Suicidal Ideation: No-Not Currently/Within Last 6 Months Has patient been a risk to self within the past 6 months prior to admission? : No Suicidal Intent: No Has patient had any suicidal intent within the past 6 months prior to admission? : No Is patient at risk for suicide?: No Suicidal Plan?: No Has patient had any suicidal plan within the past 6 months prior to admission? : No Access to Means: No What has been your use of drugs/alcohol within the last 12 months?: NA Previous Attempts/Gestures: No How many times?: 0 Other Self Harm Risks: NA Triggers for Past Attempts: Unpredictable Intentional Self Injurious Behavior: None Family Suicide History: No Recent stressful life event(s): Other (Comment) (unknown) Persecutory voices/beliefs?: No Depression: Yes Depression Symptoms: Loss of interest in usual pleasures, Feeling worthless/self pity Substance abuse history and/or treatment for substance abuse?: No Suicide prevention information given to non-admitted patients: Not applicable  Risk to Others within the past 6 months Homicidal Ideation: No Does patient  have any lifetime risk of violence toward others beyond the six months prior to admission? : No Thoughts of Harm to Others: No Current Homicidal Intent: No Current Homicidal Plan: No Access to Homicidal  Means: No Identified Victim: NA History of harm to others?: No Assessment of Violence: None Noted Violent Behavior Description: NA Does patient have access to weapons?: No Criminal Charges Pending?: No Does patient have a court date: No Is patient on probation?: No  Psychosis Hallucinations: None noted Delusions: None noted  Mental Status Report Appearance/Hygiene: Unremarkable Eye Contact: Fair Motor Activity: Unremarkable Speech: Logical/coherent Level of Consciousness: Alert Mood: Depressed Affect: Depressed Anxiety Level: Moderate Thought Processes: Coherent, Relevant Judgement: Unimpaired Orientation: Person, Place, Time, Situation Obsessive Compulsive Thoughts/Behaviors: None  Cognitive Functioning Concentration: Normal Memory: Recent Intact, Remote Intact IQ: Average Insight: Fair Impulse Control: Fair Appetite: Fair Weight Loss: 0 Weight Gain: 0 Sleep: No Change Total Hours of Sleep: 8 Vegetative Symptoms: None  ADLScreening [facility]) Patient's cognitive ability adequate to safely complete daily activities?: Yes Patient able to express need for assistance with ADLs?: Yes Independently performs ADLs?: Yes (appropriate for developmental age)  Prior Inpatient Therapy Prior Inpatient Therapy: Yes Prior Therapy Dates: 2013 [facility] Reason for Treatment: anxiety  Prior Outpatient Therapy Prior Outpatient Therapy: Yes Prior Therapy Dates: current Prior Therapy [facility] Reason for Treatment: anxiety  Does patient have an ACCT team?: No Does patient have Intensive In-House Services?  : No Does patient have [facility] services? : No Does patient have P4CC services?: No  ADL Screening (condition at time of admission) Patient's cognitive ability adequate to safely complete daily activities?: Yes Is the patient deaf or have difficulty hearing?: No Does the patient have difficulty  concentrating, remembering, or making decisions?: No Patient able to express need for assistance with ADLs?: Yes Does the patient have difficulty dressing or bathing?: No Independently performs ADLs?: Yes (appropriate for developmental age) Does the patient have difficulty walking or climbing stairs?: No Weakness of Legs: None Weakness of Arms/Hands: None       Abuse/Neglect Assessment (Assessment to be complete while patient is alone) Physical Abuse: Denies Verbal Abuse: Denies Sexual Abuse: Denies Exploitation of patient/patient's resources: Denies Self-Neglect: Denies     Merchant navy officerAdvance Directives (For Healthcare) Does Patient Have a Medical Advance Directive?: No    Additional Information 1:1 In Past 12 Months?: No CIRT Risk: No Elopement Risk: No Does patient have medical clearance?: Yes     Disposition:  Disposition Initial Assessment Completed for this Encounter: Yes Disposition of Patient: Outpatient treatment Type of outpatient treatment: Adult  Kallista Pae D 06/20/2017 11:03 AM

## 2017-06-20 NOTE — Discharge Instructions (Signed)
Follow-up with your primary care doctor and a therapist as soon as possible for further evaluation and treatment.  Use the resource guide given to you to help you find a therapist.

## 2017-06-20 NOTE — ED Notes (Signed)
Telepsych machine put in room.

## 2017-06-20 NOTE — [facility] Counselor (Signed)
Per Jacki ConesLaurie, NP Pt does not meet inpatient criteria. Recommends outpatient resources. Outpatient resources faxed to [phone].  Wolfgang PhoenixBrandi Dallis Czaja, [facility] Triage Specialist

## 2017-06-20 NOTE — ED Notes (Signed)
[facility].

## 2017-06-20 NOTE — ED Triage Notes (Addendum)
[facility].  States ativan helped, but anxiety remains.  Was told by [facility] to come here to be medically cleared so they could accept him.  PT states this am he wanted to die b/c he's so tired of dealing with anxiety.  However, he has no specific plan to hurt himself.

## 2017-06-20 NOTE — ED Triage Notes (Signed)
Pt reports chest pain that started this afternoon with left arm numbness. States it feels like his anxiety attacks, which have become more severe and more frequent recently. ekg done and no acute distress is noted at triage.

## 2017-06-20 NOTE — ED Notes (Signed)
Pt is being discharged. Outpt resources were given to pt and pt verbalized understanding at this time.

## 2017-06-20 NOTE — ED Provider Notes (Signed)
[facility] DEPT Provider Note   CSN: [id number] Arrival date & time: 06/20/17  [id number]     History   Chief Complaint Chief Complaint  Patient presents with  . Anxiety  . Suicidal    HPI John Estes is a 41 y.o. male.  Patient is here to get assistance with panic attacks.  He is having daily panic attacks, back to back for 5 days.  He has been evaluated at multiple ERs for the same problem, taking his usual medications, all without relief.  He has not been able to contact or see his psychiatrist, and does not currently have a therapist.  He is concerned that he will not be able to see a therapist if he leaves here today.  He denies suicidal or homicidal ideation.  He continues to work.  He denies home stressors drug use or alcoholism.  His panic attacks are characterized by "fever and chest pain."  No recent fever, chills, cough, shortness of breath nausea or vomiting.  There are no other known modifying factors.   HPI  Past Medical History:  Diagnosis Date  . Anxiety   . Depression     Patient Active Problem List   Diagnosis Date Noted  . Anxiety disorder 11/26/2012  . Depressive disorder 11/26/2012    Past Surgical History:  Procedure Laterality Date  . APPENDECTOMY         Home Medications    Prior to Admission medications   Medication Sig Start Date End Date Taking? Authorizing Provider  ALPRAZolam Prudy Feeler(XANAX) 1 MG tablet Take 1-2 tablets by mouth 2 (two) times daily as needed for anxiety.  05/18/17  Yes [provider]  sertraline (ZOLOFT) 100 MG tablet Take 1 tablet by mouth daily. 05/28/17  Yes [provider]  FLUoxetine (PROZAC) 20 MG capsule Take 1 capsule (20 mg total) by mouth daily. For depression Patient not taking: Reported on 06/19/2017 11/26/12   Armandina StammerNwoko, Agnes I, NP  hydrOXYzine (ATARAX/VISTARIL) 50 MG tablet Take 1 tablet (50 mg total) by mouth at bedtime as needed and may repeat dose one time if needed (insomnia). For anxiety and  sleep Patient not taking: Reported on 06/19/2017 11/26/12   Sanjuana KavaNwoko, Agnes I, NP    Family History Family History  Problem Relation Age of Onset  . Depression Sister   . Alcohol abuse Maternal Grandfather     Social History Social History  Substance Use Topics  . Smoking status: Current Every Day Smoker    Packs/day: 1.00    Types: Cigarettes  . Smokeless tobacco: Never Used  . Alcohol use No     Allergies   Penicillins   Review of Systems Review of Systems  All other systems reviewed and are negative.    Physical Exam Updated Vital Signs BP (!) 150/101 (BP Location: Right Arm)   Pulse 71   Temp 98.2 F (36.8 C) (Oral)   Resp 16   Ht 5\' 10"  (1.778 m)   Wt 99.8 kg (220 lb)   SpO2 95%   BMI 31.57 kg/m   Physical Exam  Constitutional: He is oriented to person, place, and time. He appears well-developed and well-nourished. No distress.  HENT:  Head: Normocephalic and atraumatic.  Right Ear: External ear normal.  Left Ear: External ear normal.  Eyes: Pupils are equal, round, and reactive to light. Conjunctivae and EOM are normal.  Neck: Normal range of motion and phonation normal. Neck supple.  Cardiovascular: Normal rate.   Pulmonary/Chest: Effort normal. He exhibits no  bony tenderness.  Musculoskeletal: Normal range of motion.  Neurological: He is alert and oriented to person, place, and time. No cranial nerve deficit or sensory deficit. He exhibits normal muscle tone. Coordination normal.  Skin: Skin is warm, dry and intact.  Psychiatric: He has a normal mood and affect. His behavior is normal. Judgment and thought content normal.  He is not responding to internal stimuli.  He is calm and interactive.  Nursing note and vitals reviewed.    ED Treatments / Results  Labs (all labs ordered are listed, but only abnormal results are displayed) Labs Reviewed  COMPREHENSIVE METABOLIC PANEL - Abnormal; Notable for the following:       Result Value   CO2 21 (*)     AST 47 (*)    ALT 69 (*)    All other components within normal limits  ACETAMINOPHEN LEVEL - Abnormal; Notable for the following:    Acetaminophen (Tylenol), Serum <10 (*)    All other components within normal limits  CBC - Abnormal; Notable for the following:    WBC 13.7 (*)    All other components within normal limits  RAPID URINE DRUG SCREEN, HOSP PERFORMED - Abnormal; Notable for the following:    Benzodiazepines POSITIVE (*)    All other components within normal limits  ETHANOL  SALICYLATE LEVEL    EKG  EKG Interpretation None       Radiology No results found.  Procedures Procedures (including critical care time)  Medications Ordered in ED Medications  acetaminophen (TYLENOL) tablet 650 mg (not administered)     Initial Impression / Assessment and Plan / ED Course  I have reviewed the triage vital signs and the nursing notes.  Pertinent labs & imaging results that were available during my care of the patient were reviewed by me and considered in my medical decision making (see chart for details).      Patient Vitals for the past 24 hrs:  BP Temp Temp src Pulse Resp SpO2 Height Weight  06/20/17 0926 - - - - - - 5\' 10"  (1.778 m) 99.8 kg (220 lb)  06/20/17 0925 (!) 150/101 98.2 F (36.8 C) Oral 71 16 95 % - -   Case discussed with TTS who sent a resource guide for the patient, to help him find a therapist.  11:56 AM Reevaluation with update and discussion. After initial assessment and treatment, an updated evaluation reveals no change in clinical status.Mancel Bale. Sandar Krinke L    Final Clinical Impressions(s) / ED Diagnoses   Final diagnoses:  Anxiety attack    Anxiety disorder with frequent panic attacks; in the ED today, not suicidal or having a panic attack.  He has had several ED visits recently.  Nursing Notes Reviewed/ Care Coordinated Applicable Imaging Reviewed Interpretation of Laboratory Data incorporated into ED treatment  The patient  appears reasonably screened and/or stabilized for discharge and I doubt any other medical condition or other [facility] requiring further screening, evaluation, or treatment in the ED at this time prior to discharge.  Plan: Home Medications-continue usual medications; Home Treatments-relaxation; return here if the recommended treatment, does not improve the symptoms; Recommended follow up-PCP and therapist and psychiatry as soon as possible     New Prescriptions New Prescriptions   No medications on file     Mancel BaleWentz, Orel Cooler, MD 06/20/17 1157

## 2017-06-20 NOTE — ED Provider Notes (Signed)
[facility] Provider Note   CSN: [id number] Arrival date & time: 06/20/17  1734     History   Chief Complaint Chief Complaint  Patient presents with  . Chest Pain  . Numbness    HPI John Estes is a 41 y.o. male.  HPI   41 year old male with history of anxiety, depression presenting with complaint of anxiety attack. This is his third ER visits within the past 2 days for the same.Patient states for the past for 5 days he has had recurrent bouts of what he felt to be anxiety attack. He described it as headache, tingling sensation to his left arm, sharp pain running across his chest, feeling anxious which usually lasting for a few hours and improve when he takes his anti-anxiety medication. He feels that this symptom is getting more out of control due to the recurrent nature of it. He is unsure of any precipitating factor that causes symptoms. He does have a psychiatrist which she follow-up every 6 months. He uses alcohol on occasion, he is a smoker but denies any illicit drug use. He denies any recent sickness, or any changes in medication. Does not have any significant cardiac history or family history of cardiac disease. No history of PE or DVT. He is still able to work his job. He denies any SI/HI/auditory or visual hallucination. He denies feeling very depressed.    Past Medical History:  Diagnosis Date  . Anxiety   . Depression     Patient Active Problem List   Diagnosis Date Noted  . Anxiety disorder 11/26/2012  . Depressive disorder 11/26/2012    Past Surgical History:  Procedure Laterality Date  . APPENDECTOMY         Home Medications    Prior to Admission medications   Medication Sig Start Date End Date Taking? Authorizing Provider  ALPRAZolam Prudy Feeler(XANAX) 1 MG tablet Take 1-2 tablets by mouth 2 (two) times daily as needed for anxiety.  05/18/17   [provider]  FLUoxetine (PROZAC) 20 MG capsule Take 1 capsule (20 mg total) by mouth daily. For  depression Patient not taking: Reported on 06/19/2017 11/26/12   Armandina StammerNwoko, Agnes I, NP  hydrOXYzine (ATARAX/VISTARIL) 50 MG tablet Take 1 tablet (50 mg total) by mouth at bedtime as needed and may repeat dose one time if needed (insomnia). For anxiety and sleep Patient not taking: Reported on 06/19/2017 11/26/12   Armandina StammerNwoko, Agnes I, NP  sertraline (ZOLOFT) 100 MG tablet Take 1 tablet by mouth daily. 05/28/17   [provider]    Family History Family History  Problem Relation Age of Onset  . Depression Sister   . Alcohol abuse Maternal Grandfather     Social History Social History  Substance Use Topics  . Smoking status: Current Every Day Smoker    Packs/day: 1.00    Types: Cigarettes  . Smokeless tobacco: Never Used  . Alcohol use No     Allergies   Penicillins   Review of Systems Review of Systems  All other systems reviewed and are negative.    Physical Exam Updated Vital Signs BP (!) 149/84 (BP Location: Right Arm)   Pulse (!) 51   Temp 97.8 F (36.6 C) (Oral)   Resp 14   SpO2 97%   Physical Exam  Constitutional: He appears well-developed and well-nourished. No distress.  Patient is well groomed, calm, laying in bed in no acute discomfort.  HENT:  Head: Atraumatic.  Eyes: Conjunctivae are normal.  Neck: Neck supple.  Cardiovascular: Normal rate and regular rhythm.   Pulmonary/Chest: Effort normal and breath sounds normal.  Abdominal: Soft. He exhibits no distension. There is no tenderness.  Neurological: He is alert. No cranial nerve deficit or sensory deficit. GCS eye subscore is 4. GCS verbal subscore is 5. GCS motor subscore is 6.  Skin: No rash noted.  Psychiatric: He has a normal mood and affect. His speech is normal and behavior is normal. Thought content is not paranoid. He expresses no homicidal and no suicidal ideation.  Nursing note and vitals reviewed.    ED Treatments / Results  Labs (all labs ordered are listed, but only abnormal results  are displayed) Labs Reviewed  CBC - Abnormal; Notable for the following:       Result Value   WBC 11.4 (*)    All other components within normal limits  BASIC METABOLIC PANEL  I-STAT TROPONIN, ED    EKG  EKG Interpretation None      Date: 06/20/2017  Rate: 59  Rhythm: sinus bradycardia  QRS Axis: normal  Intervals: normal  ST/T Wave abnormalities: normal  Conduction Disutrbances: none  Narrative Interpretation:   Old EKG Reviewed: No significant changes noted     Radiology Dg Chest 2 View  Result Date: 06/20/2017 CLINICAL DATA:  Chest pain. EXAM: CHEST  2 VIEW COMPARISON:  None. FINDINGS: The heart size and mediastinal contours are within normal limits. Both lungs are clear. No pneumothorax or pleural effusion is noted. The visualized skeletal structures are unremarkable. IMPRESSION: No active cardiopulmonary disease. Electronically Signed   By: Lupita RaiderJames  Green Jr, M.D.   On: 06/20/2017 18:33    Procedures Procedures (including critical care time)  Medications Ordered in ED Medications - No data to display   Initial Impression / Assessment and Plan / ED Course  I have reviewed the triage vital signs and the nursing notes.  Pertinent labs & imaging results that were available during my care of the patient were reviewed by me and considered in my medical decision making (see chart for details).     BP (!) 149/84 (BP Location: Right Arm)   Pulse (!) 51   Temp 97.8 F (36.6 C) (Oral)   Resp 14   SpO2 97%    Final Clinical Impressions(s) / ED Diagnoses   Final diagnoses:  Panic attacks  Atypical chest pain    New Prescriptions Current Discharge Medication List     Patient with history of depression and anxiety here with symptoms suggestive of a panic attack. He is back to his baseline. No SI/HI or hallucination. His chest discomfort and left arm pain is atypical for ACS. Heart score of 2, low risk of MACE.  PERC negative, doubt PE.  I encouraged patient to  follow-up with his psychiatrist further management of his condition. He will be prescribed Vistaril to use as needed for his anxiety. List of resources provided as well. Return precaution discussed. Patient is stable for discharge.   Fayrene Helperran, Norah Devin, PA-C 06/20/17 2212    Abelino DerrickMackuen, Courteney Lyn, MD 06/20/17 [phone]

## 2018-03-11 IMAGING — CR DG CHEST 2V
2 series · 2 of 2 positions shown · non-contrast
Comparison: None.

CLINICAL DATA: Chest pain.

EXAM:
CHEST  2 VIEW

[chest pa]
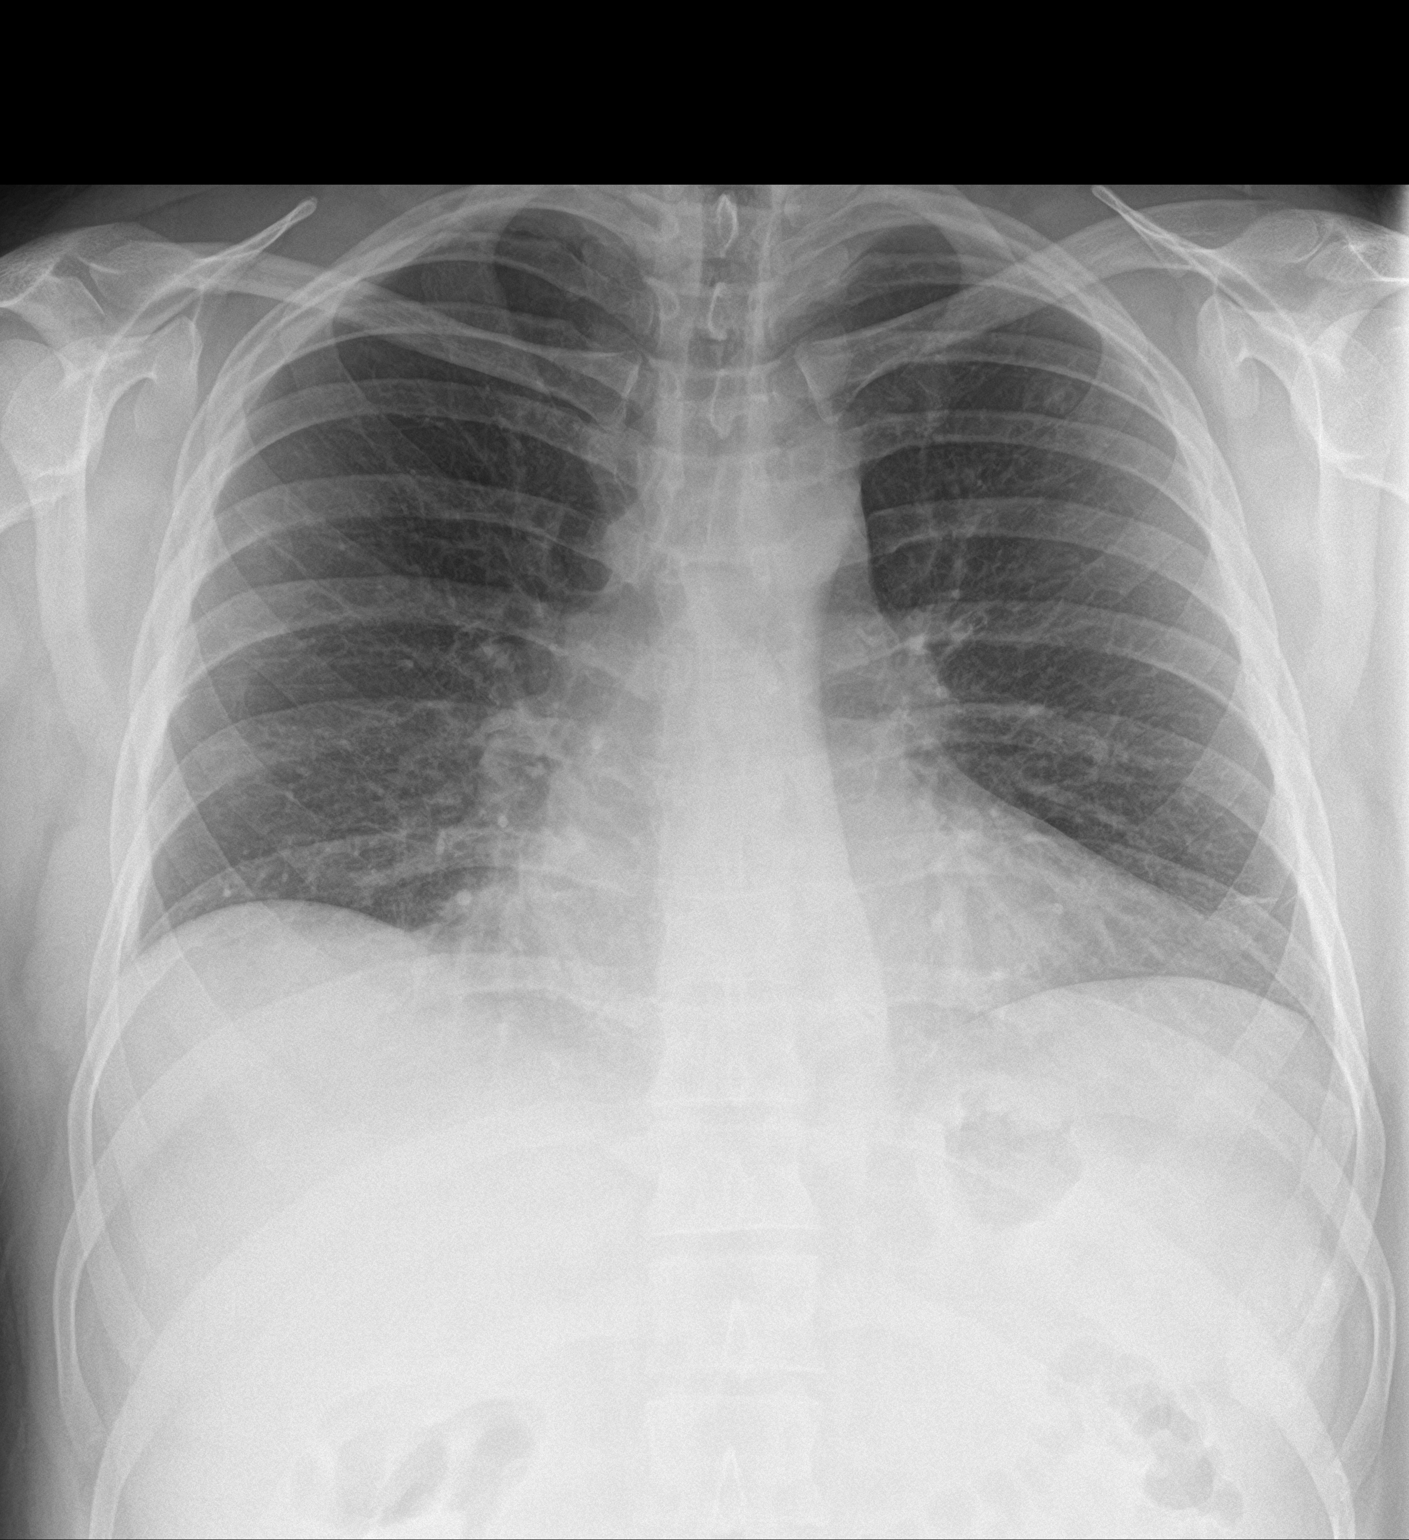

[chest lat]
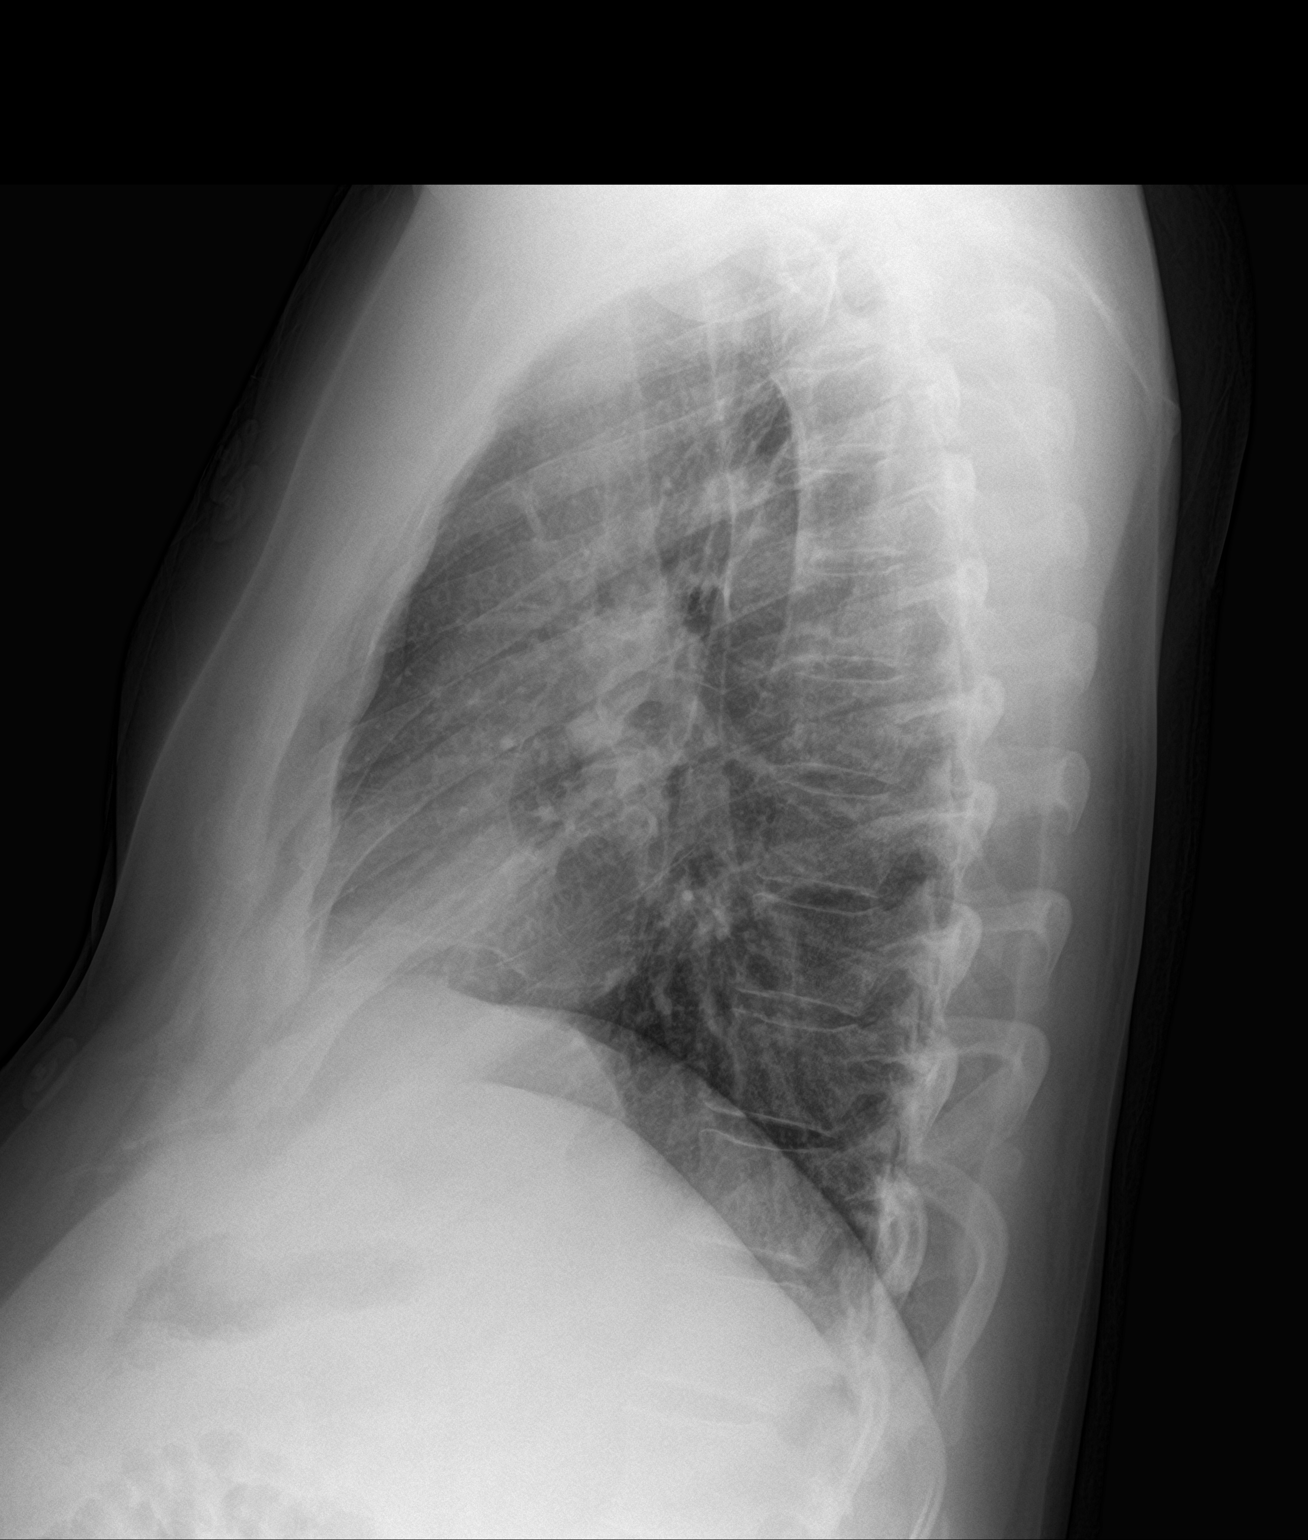

[2 of 2 positions shown; findings below may reference images not displayed]

FINDINGS: The heart size and mediastinal contours are within normal limits.
Both lungs are clear. No pneumothorax or pleural effusion is noted.
The visualized skeletal structures are unremarkable.
IMPRESSION: No active cardiopulmonary disease.

## 2018-09-19 DIAGNOSIS — F339 Major depressive disorder, recurrent, unspecified: Secondary | ICD-10-CM | POA: Insufficient documentation

## 2018-09-19 DIAGNOSIS — G4700 Insomnia, unspecified: Secondary | ICD-10-CM | POA: Insufficient documentation

## 2018-09-26 ENCOUNTER — Other Ambulatory Visit: Payer: Self-pay | Admitting: Physician Assistant

## 2018-09-26 MED ORDER — ALPRAZOLAM 1 MG PO TABS: ORAL_TABLET | ORAL | 0 refills | Status: DC

## 2018-10-01 ENCOUNTER — Ambulatory Visit (INDEPENDENT_AMBULATORY_CARE_PROVIDER_SITE_OTHER): Payer: 59 | Admitting: Physician Assistant

## 2018-10-01 ENCOUNTER — Encounter: Payer: Self-pay | Admitting: Physician Assistant

## 2018-10-01 DIAGNOSIS — F17200 Nicotine dependence, unspecified, uncomplicated: Secondary | ICD-10-CM | POA: Diagnosis not present

## 2018-10-01 DIAGNOSIS — F331 Major depressive disorder, recurrent, moderate: Secondary | ICD-10-CM | POA: Diagnosis not present

## 2018-10-01 DIAGNOSIS — F4010 Social phobia, unspecified: Secondary | ICD-10-CM | POA: Diagnosis not present

## 2018-10-01 DIAGNOSIS — G4700 Insomnia, unspecified: Secondary | ICD-10-CM

## 2018-10-01 MED ORDER — SERTRALINE HCL 100 MG PO TABS: 200.0000 mg | ORAL_TABLET | Freq: Every day | ORAL | 5 refills | Status: DC

## 2018-10-01 MED ORDER — VARENICLINE TARTRATE 0.5 MG X 11 & 1 MG X 42 PO MISC: ORAL | 0 refills | Status: DC

## 2018-10-01 MED ORDER — ALPRAZOLAM 1 MG PO TABS: 0.5000 mg | ORAL_TABLET | Freq: Two times a day (BID) | ORAL | 3 refills | Status: DC | PRN

## 2018-10-01 MED ORDER — TRAZODONE HCL 50 MG PO TABS: 50.0000 mg | ORAL_TABLET | Freq: Every evening | ORAL | 5 refills | Status: DC | PRN

## 2018-10-01 NOTE — Progress Notes (Signed)
[facility]  Patient ID: John Estes,  MRN: [id number]  PCP: Verl Bangs, MD  Date of Evaluation: 10/01/2018 Time spent:15 minutes  Chief Complaint:  Chief Complaint    Follow-up      HISTORY/CURRENT STATUS: HPI For 6 month med check.  Doing well overall.  Has a lot of stress relieved b/c he's no longer doing a bunch of things at work that he wasn't getting paid for. Relieved about that.  Able to enjoy things, energy and motivation are normal, not isolating.    Saturday was the anniversary of the death of his father. " There've been some tears, but I've done pretty good.  I didn't have any severe panic attacks that made me go to the ER like I've had in the past." Still needing the Xanax some and it still works.    Past medications for mental health diagnoses include: Prozac, hydroxyzine  Individual Medical History/ Review of Systems: Changes? :No   Allergies: Penicillins  Current Medications:  Current Outpatient Medications:  .  ALPRAZolam (XANAX) 1 MG tablet, Take 0.5-1 tablets (0.5-1 mg total) by mouth 2 (two) times daily as needed for anxiety., Disp: 60 tablet, Rfl: 3 .  sertraline (ZOLOFT) 100 MG tablet, Take 2 tablets (200 mg total) by mouth daily., Disp: 60 tablet, Rfl: 5 .  traZODone (DESYREL) 50 MG tablet, Take 1 tablet (50 mg total) by mouth at bedtime as needed for sleep (1/2 - 2 tabs)., Disp: 60 tablet, Rfl: 5 .  varenicline (CHANTIX STARTING MONTH PAK) 0.5 MG X 11 & 1 MG X 42 tablet, Take one 0.5 mg tablet by mouth once daily for 3 days, then increase to one 0.5 mg tablet twice daily for 4 days, then increase to one 1 mg tablet twice daily., Disp: 53 tablet, Rfl: 0 Medication Side Effects: none  Family Medical/ Social History: Changes? Yes decreased work load at his job  MENTAL HEALTH EXAM:  There were no vitals taken for this visit.There is no height or weight on file to calculate BMI.  General Appearance: Well Groomed  Eye Contact:   Good  Speech:  Clear and Coherent  Volume:  Normal  Mood:  Euthymic  Affect:  Appropriate  Thought Process:  Goal Directed  Orientation:  Full (Time, Place, and Person)  Thought Content: Logical   Suicidal Thoughts:  No  Homicidal Thoughts:  No  Memory:  WNL  Judgement:  Good  Insight:  Good  Psychomotor Activity:  Normal  Concentration:  Concentration: Good  Recall:  Good  Fund of Knowledge: Good  Language: Good  Assets:  Desire for Improvement  ADL's:  Intact  Cognition: WNL  Prognosis:  Good    DIAGNOSES:    ICD-10-CM   1. Social anxiety disorder F40.10   2. Major depressive disorder, recurrent episode, moderate (HCC) F33.1   3. Insomnia, unspecified type G47.00   4. Smoker F17.200     Receiving Psychotherapy: No    RECOMMENDATIONS: Continue Zoloft, Xanax, and trazodone, as noted above. We discussed smoking cessation and he is very interested in starting Chantix.  States that he will not start it until after the first of the year most likely.  We discussed benefits, risk and side effects.  He knows to call and stop the medication if he gets worsening of depression or suicidal thoughts. Return in 6 months or sooner as needed.   Melony Overly, PA-C

## 2019-03-20 ENCOUNTER — Other Ambulatory Visit: Payer: Self-pay

## 2019-03-20 MED ORDER — ALPRAZOLAM 1 MG PO TABS: 0.5000 mg | ORAL_TABLET | Freq: Two times a day (BID) | ORAL | 3 refills | Status: DC | PRN

## 2019-04-01 ENCOUNTER — Other Ambulatory Visit: Payer: Self-pay

## 2019-04-01 ENCOUNTER — Ambulatory Visit: Payer: 59 | Admitting: Physician Assistant

## 2019-04-01 ENCOUNTER — Encounter: Payer: Self-pay | Admitting: Physician Assistant

## 2019-04-01 DIAGNOSIS — G4700 Insomnia, unspecified: Secondary | ICD-10-CM

## 2019-04-01 DIAGNOSIS — F4010 Social phobia, unspecified: Secondary | ICD-10-CM | POA: Diagnosis not present

## 2019-04-01 MED ORDER — TRAZODONE HCL 50 MG PO TABS: 50.0000 mg | ORAL_TABLET | Freq: Every evening | ORAL | 5 refills | Status: DC | PRN

## 2019-04-01 MED ORDER — SERTRALINE HCL 100 MG PO TABS: 200.0000 mg | ORAL_TABLET | Freq: Every day | ORAL | 5 refills | Status: DC

## 2019-04-01 NOTE — Progress Notes (Signed)
[facility]  Patient ID: John CaterDaniel Estes,  MRN: [id number]  PCP: Verl Bangsadiontchenko, Alexei, MD  Date of Evaluation: 04/01/2019 Time spent:15 minutes  Chief Complaint:  Chief Complaint    Follow-up     Virtual Visit via Telephone Note  I connected with patient by a video enabled telemedicine application or telephone, with their informed consent, and verified patient privacy and that I am speaking with the correct person using two identifiers.  I am private, in my home and the patient is at work.  I discussed the limitations, risks, security and privacy concerns of performing an evaluation and management service by telephone and the availability of in person appointments. I also discussed with the patient that there may be a patient responsible charge related to this service. The patient expressed understanding and agreed to proceed.   I discussed the assessment and treatment plan with the patient. The patient was provided an opportunity to ask questions and all were answered. The patient agreed with the plan and demonstrated an understanding of the instructions.   The patient was advised to call back or seek an in-person evaluation if the symptoms worsen or if the condition fails to improve as anticipated.  I provided 15 minutes of non-face-to-face time during this encounter.  HISTORY/CURRENT STATUS: HPI For routine med check.  Has been a little more depressed lately, just feels 'blah' when he gets home.  "I think it is because of what is going on right now.  Probably everybody feels that way.  There is nothing to do and nowhere to go when I get off work.  I am just bored."  Feels like the medications are working well at these doses.  He is doing fine as far as the Xanax for anxiety and the trazodone is helpful to get his mind to shut off so he can go to sleep.  In the past he was using the Xanax for that reason.  He is able to enjoy things.  His energy and motivation are good  for the most part.  Sometimes he will procrastinate for example doing laundry or something, just because he feels "blah."  He is not isolating on purpose and is not crying easily.  He has no suicidal or homicidal thoughts.  Denies muscle or joint pain, stiffness, or dystonia. Denies dizziness, syncope, seizures, numbness, tingling, tremor, tics, unsteady gait, slurred speech, confusion.   Individual Medical History/ Review of Systems: Changes? :No    Past medications for mental health diagnoses include: Prozac, hydroxyzine  Allergies: Penicillins  Current Medications:  Current Outpatient Medications:  .  ALPRAZolam (XANAX) 1 MG tablet, Take 0.5-1 tablets (0.5-1 mg total) by mouth 2 (two) times daily as needed for anxiety., Disp: 60 tablet, Rfl: 3 .  sertraline (ZOLOFT) 100 MG tablet, Take 2 tablets (200 mg total) by mouth daily., Disp: 60 tablet, Rfl: 5 .  traZODone (DESYREL) 50 MG tablet, Take 1 tablet (50 mg total) by mouth at bedtime as needed for sleep (1/2 - 2 tabs)., Disp: 60 tablet, Rfl: 5 Medication Side Effects: none  Family Medical/ Social History: Changes? Yes d/t social distancing w/ coronavirus pandemic.  He is still working.  MENTAL HEALTH EXAM:  There were no vitals taken for this visit.There is no height or weight on file to calculate BMI.  General Appearance: unable to assess  Eye Contact:  unable to assess  Speech:  Clear and Coherent  Volume:  Normal  Mood:  Euthymic  Affect:  unable to assess  Thought Process:  Goal Directed  Orientation:  Full (Time, Place, and Person)  Thought Content: Logical   Suicidal Thoughts:  No  Homicidal Thoughts:  No  Memory:  WNL  Judgement:  Good  Insight:  Good  Psychomotor Activity:  unable to assess  Concentration:  Concentration: Good  Recall:  Good  Fund of Knowledge: Good  Language: Good  Assets:  Desire for Improvement  ADL's:  Intact  Cognition: WNL  Prognosis:  Good    DIAGNOSES:    ICD-10-CM   1. Social  anxiety disorder F40.10   2. Insomnia, unspecified type G47.00     Receiving Psychotherapy: No    RECOMMENDATIONS:  Continue Zoloft 200 mg daily. Continue Xanax 1 mg 1/2-1 p.o. twice daily as needed anxiety. Continue trazodone 50 to 100 mg nightly as needed sleep. We discussed the current coronavirus pandemic and hopefully the shelter in place orders will be reversed soon.  We still need to follow social distancing but at least he can get out and do things more.  He will let me know if he gets more depressed at any point before the next visit. Return in 6 months.  Melony Overly, PA-C   This record has been created using [employer].  Chart creation errors have been sought, but may not always have been located and corrected. Such creation errors do not reflect on the standard of medical care.

## 2019-08-05 ENCOUNTER — Other Ambulatory Visit: Payer: Self-pay | Admitting: Physician Assistant

## 2019-08-07 NOTE — Telephone Encounter (Signed)
Next appt 11/03

## 2019-09-30 ENCOUNTER — Ambulatory Visit: Payer: 59 | Admitting: Physician Assistant

## 2019-10-19 ENCOUNTER — Other Ambulatory Visit: Payer: Self-pay | Admitting: Physician Assistant

## 2019-10-21 ENCOUNTER — Other Ambulatory Visit: Payer: Self-pay | Admitting: Physician Assistant

## 2019-12-17 ENCOUNTER — Other Ambulatory Visit: Payer: Self-pay | Admitting: Physician Assistant

## 2019-12-18 NOTE — Telephone Encounter (Signed)
Last apt 03/2019 No show in November nothing rescheduled

## 2019-12-31 ENCOUNTER — Other Ambulatory Visit: Payer: Self-pay

## 2019-12-31 ENCOUNTER — Telehealth: Payer: Self-pay | Admitting: Physician Assistant

## 2019-12-31 NOTE — Telephone Encounter (Signed)
Pt would like a refill on Xanax. Please send to [facility] in [place] [address]

## 2020-01-02 NOTE — Telephone Encounter (Signed)
Patient has an appt on Monday 2/8

## 2020-01-05 ENCOUNTER — Encounter: Payer: Self-pay | Admitting: Physician Assistant

## 2020-01-05 ENCOUNTER — Ambulatory Visit (INDEPENDENT_AMBULATORY_CARE_PROVIDER_SITE_OTHER): Payer: 59 | Admitting: Physician Assistant

## 2020-01-05 ENCOUNTER — Other Ambulatory Visit: Payer: Self-pay

## 2020-01-05 DIAGNOSIS — F4010 Social phobia, unspecified: Secondary | ICD-10-CM | POA: Diagnosis not present

## 2020-01-05 DIAGNOSIS — F3341 Major depressive disorder, recurrent, in partial remission: Secondary | ICD-10-CM

## 2020-01-05 MED ORDER — ALPRAZOLAM 1 MG PO TABS: ORAL_TABLET | ORAL | 5 refills | Status: DC

## 2020-01-05 MED ORDER — SERTRALINE HCL 100 MG PO TABS: ORAL_TABLET | ORAL | 5 refills | Status: DC

## 2020-01-05 MED ORDER — TRAZODONE HCL 50 MG PO TABS: ORAL_TABLET | ORAL | 5 refills | Status: DC

## 2020-01-05 NOTE — Progress Notes (Signed)
[facility]  Patient ID: John Estes,  MRN: [id number]  PCP: Verl Bangs, MD  Date of Evaluation: 01/05/2020 Time spent:20 minutes  Chief Complaint:  Chief Complaint    Follow-up; Anxiety      HISTORY/CURRENT STATUS: HPI  For routine med check.  Doing well.  Anxiety is well-controlled w/ Xanax, does need it every day. Sleeps good.  The Trazodone helps a lot.  Patient denies loss of interest in usual activities and is able to enjoy things.  Denies decreased energy or motivation.  Appetite has not changed.  No extreme sadness, tearfulness, or feelings of hopelessness.  Denies any changes in concentration, making decisions or remembering things.  Denies suicidal or homicidal thoughts.  Denies dizziness, syncope, seizures, numbness, tingling, tremor, tics, unsteady gait, slurred speech, confusion. Denies muscle or joint pain, stiffness, or dystonia.  Individual Medical History/ Review of Systems: Changes? :No    Past medications for mental health diagnoses include: Prozac, hydroxyzine  Allergies: Penicillins  Current Medications:  Current Outpatient Medications:  .  ALPRAZolam (XANAX) 1 MG tablet, 1 po bid prn., Disp: 60 tablet, Rfl: 5 .  sertraline (ZOLOFT) 100 MG tablet, TAKE 2 TABLETS(200 MG) BY MOUTH DAILY, Disp: 60 tablet, Rfl: 5 .  traZODone (DESYREL) 50 MG tablet, TAKE 1/2 TO 2 TABLETS BY MOUTH AT BEDTIME AS NEEDED FOR SLEEP, Disp: 60 tablet, Rfl: 5 Medication Side Effects: none  Family Medical/ Social History: Changes? No  MENTAL HEALTH EXAM:  There were no vitals taken for this visit.There is no height or weight on file to calculate BMI.  General Appearance: Casual, Neat and Well Groomed  Eye Contact:  Good  Speech:  Clear and Coherent  Volume:  Normal  Mood:  Euthymic  Affect:  Appropriate  Thought Process:  Goal Directed and Descriptions of Associations: Intact  Orientation:  Full (Time, Place, and Person)  Thought Content: Logical    Suicidal Thoughts:  No  Homicidal Thoughts:  No  Memory:  WNL  Judgement:  Good  Insight:  Good  Psychomotor Activity:  Normal  Concentration:  Concentration: Good  Recall:  Good  Fund of Knowledge: Good  Language: Good  Assets:  Desire for Improvement  ADL's:  Intact  Cognition: WNL  Prognosis:  Good    DIAGNOSES:    ICD-10-CM   1. Social anxiety disorder  F40.10   2. Recurrent major depressive disorder, in partial remission (HCC)  F33.41     Receiving Psychotherapy: No    RECOMMENDATIONS:  PDMP reviewed.  Continue Xanax 1 mg, 1 bid prn. Continue Zoloft  100 mg, 2 qd. Continue Trazodone 50 mg, 1/2-1 qhs prn sleep. Return in 6 months.  Melony Overly, PA-C

## 2020-06-23 ENCOUNTER — Other Ambulatory Visit: Payer: Self-pay | Admitting: Physician Assistant

## 2020-06-25 NOTE — Telephone Encounter (Signed)
Apt 08/09

## 2020-07-01 ENCOUNTER — Other Ambulatory Visit: Payer: Self-pay | Admitting: Physician Assistant

## 2020-07-05 ENCOUNTER — Encounter: Payer: Self-pay | Admitting: Physician Assistant

## 2020-07-05 ENCOUNTER — Telehealth: Payer: 59 | Admitting: Physician Assistant

## 2020-07-05 ENCOUNTER — Telehealth: Payer: Self-pay | Admitting: Physician Assistant

## 2020-07-05 DIAGNOSIS — F17200 Nicotine dependence, unspecified, uncomplicated: Secondary | ICD-10-CM

## 2020-07-05 DIAGNOSIS — F3341 Major depressive disorder, recurrent, in partial remission: Secondary | ICD-10-CM

## 2020-07-05 DIAGNOSIS — F4010 Social phobia, unspecified: Secondary | ICD-10-CM

## 2020-07-05 DIAGNOSIS — G4700 Insomnia, unspecified: Secondary | ICD-10-CM | POA: Diagnosis not present

## 2020-07-05 MED ORDER — TRAZODONE HCL 50 MG PO TABS: 50.0000 mg | ORAL_TABLET | Freq: Every evening | ORAL | 5 refills | Status: DC | PRN

## 2020-07-05 MED ORDER — ALPRAZOLAM 1 MG PO TABS: ORAL_TABLET | ORAL | 5 refills | Status: DC

## 2020-07-05 MED ORDER — SERTRALINE HCL 100 MG PO TABS: 200.0000 mg | ORAL_TABLET | Freq: Every day | ORAL | 5 refills | Status: DC

## 2020-07-05 NOTE — Telephone Encounter (Signed)
[name], stockley are scheduled for a virtual visit with your provider today.    Just as we do with appointments in the office, we must obtain your consent to participate.  Your consent will be active for this visit and any virtual visit you may have with one of our providers in the next 365 days.    If you have a MyChart account, I can also send a copy of this consent to you electronically.  All virtual visits are billed to your insurance company just like a traditional visit in the office.  As this is a virtual visit, video technology does not allow for your provider to perform a traditional examination.  This may limit your provider's ability to fully assess your condition.  If your provider identifies any concerns that need to be evaluated in person or the need to arrange testing such as labs, EKG, etc, we will make arrangements to do so.    Although advances in technology are sophisticated, we cannot ensure that it will always work on either your end or our end.  If the connection with a video visit is poor, we may have to switch to a telephone visit.  With either a video or telephone visit, we are not always able to ensure that we have a secure connection.   I need to obtain your verbal consent now.   Are you willing to proceed with your visit today?   John Estes has provided verbal consent on 07/05/2020 for a virtual visit (video or telephone).   Melony Overly, PA-C 07/05/2020  5:10 PM

## 2020-07-05 NOTE — Progress Notes (Signed)
[facility]  Patient ID: John Estes,  MRN: [id number]  PCP: Verl Bangs, MD  Date of Evaluation: 07/05/2020 Time spent:20 minutes  Chief Complaint:  Chief Complaint    Follow-up     Virtual Visit via Telehealth  I connected with patient by a video enabled telemedicine application, with their informed consent, and verified patient privacy and that I am speaking with the correct person using two identifiers.  I am private, in my office and the patient is at work.  I discussed the limitations, risks, security and privacy concerns of performing an evaluation and management service by video and the availability of in person appointments. I also discussed with the patient that there may be a patient responsible charge related to this service. The patient expressed understanding and agreed to proceed.   I discussed the assessment and treatment plan with the patient. The patient was provided an opportunity to ask questions and all were answered. The patient agreed with the plan and demonstrated an understanding of the instructions.   The patient was advised to call back or seek an in-person evaluation if the symptoms worsen or if the condition fails to improve as anticipated.  I provided 20 minutes of non-face-to-face time during this encounter.  HISTORY/CURRENT STATUS: HPI  For routine med check.  Nery was sick last week with gastroenteritis.  He ended up going to the emergency room on 3 different days.  Needed IV fluids each time.  States they were concerned that he may have pneumonia but did not feel like he needed antibiotics.  Today is his first day back at work and he is weak but feeling better physically.  The anxiety is still a problem and has been a little bit worse over the past few months, just because of busy times at work.  He still tries to take as little Xanax as possible.  It is still effective when he needs it.  He is not having panic attacks so much  is generalized anxiety or ruminating thoughts about things.  Patient denies loss of interest in usual activities and is able to enjoy things.  Denies decreased energy or motivation.  Appetite has not changed.  No extreme sadness, tearfulness, or feelings of hopelessness.  Denies any changes in concentration, making decisions or remembering things.  Sleeps well most of the time, often with the assistance of trazodone.  Denies suicidal or homicidal thoughts.  Denies dizziness, syncope, seizures, numbness, tingling, tremor, tics, unsteady gait, slurred speech, confusion. Denies muscle or joint pain, stiffness, or dystonia.  Individual Medical History/ Review of Systems: Changes? :Yes See HPI  Past medications for mental health diagnoses include: Prozac, hydroxyzine  Allergies: Penicillins  Current Medications:  Current Outpatient Medications:  .  ALPRAZolam (XANAX) 1 MG tablet, TAKE 1 TABLET BY MOUTH TWICE DAILY AS NEEDED, Disp: 60 tablet, Rfl: 5 .  sertraline (ZOLOFT) 100 MG tablet, Take 2 tablets (200 mg total) by mouth daily., Disp: 60 tablet, Rfl: 5 .  traZODone (DESYREL) 50 MG tablet, Take 1-2 tablets (50-100 mg total) by mouth at bedtime as needed for sleep., Disp: 60 tablet, Rfl: 5 Medication Side Effects: none  Family Medical/ Social History: Changes? No  MENTAL HEALTH EXAM:  There were no vitals taken for this visit.There is no height or weight on file to calculate BMI.  General Appearance: Casual, Neat and Well Groomed  Eye Contact:  Good  Speech:  Clear and Coherent and Normal Rate  Volume:  Normal  Mood:  Euthymic  Affect:  Appropriate  Thought Process:  Goal Directed and Descriptions of Associations: Intact  Orientation:  Full (Time, Place, and Person)  Thought Content: Logical   Suicidal Thoughts:  No  Homicidal Thoughts:  No  Memory:  WNL  Judgement:  Good  Insight:  Good  Psychomotor Activity:  Normal  Concentration:  Concentration: Good  Recall:  Good  Fund of  Knowledge: Good  Language: Good  Assets:  Desire for Improvement  ADL's:  Intact  Cognition: WNL  Prognosis:  Good    DIAGNOSES:    ICD-10-CM   1. Social anxiety disorder  F40.10   2. Recurrent major depressive disorder, in partial remission (HCC)  F33.41   3. Insomnia, unspecified type  G47.00   4. Smoker  F17.200     Receiving Psychotherapy: No    RECOMMENDATIONS:  PDMP reviewed.  I provided 20 minutes of nonface-to-face time during this encounter. I am glad he is feeling better physically. Discussed smoking cessation.  He will let me know when he is ready to quit and if he needs help. Continue Xanax 1 mg, 1 bid prn. Continue Zoloft  100 mg, 2 qd. Continue Trazodone 50 mg, 1/2-1 qhs prn sleep. Return in 6 months.  Melony Overly, PA-C

## 2020-11-02 ENCOUNTER — Other Ambulatory Visit: Payer: Self-pay

## 2020-11-02 ENCOUNTER — Emergency Department
Admission: EM | Admit: 2020-11-02 | Discharge: 2020-11-02 | Disposition: A | Discharge status: Home / Self Care | Payer: 59 | Source: Home / Self Care | Attending: Family Medicine | Admitting: Family Medicine

## 2020-11-02 DIAGNOSIS — R531 Weakness: Secondary | ICD-10-CM | POA: Diagnosis not present

## 2020-11-02 MED ORDER — ONDANSETRON 4 MG PO TBDP
4.0000 mg | ORAL_TABLET | Freq: Once | ORAL | Status: AC
  Administered 2020-11-02: 4 mg via ORAL

## 2020-11-02 MED ORDER — ONDANSETRON HCL 8 MG PO TABS: 8.0000 mg | ORAL_TABLET | Freq: Three times a day (TID) | ORAL | 0 refills | Status: DC | PRN

## 2020-11-02 MED ORDER — ALUM & MAG HYDROXIDE-SIMETH 200-200-20 MG/5ML PO SUSP
30.0000 mL | Freq: Once | ORAL | Status: AC
  Administered 2020-11-02: 30 mL via ORAL

## 2020-11-02 MED ORDER — OMEPRAZOLE 20 MG PO CPDR: 20.0000 mg | DELAYED_RELEASE_CAPSULE | Freq: Every day | ORAL | 0 refills | Status: DC

## 2020-11-02 NOTE — ED Triage Notes (Signed)
Patient states since Saturday he has been feeling weak and it is progressively worsening. Pt states he also has had nausea and vomiting since Sunday approximately 4 episodes. Pt states this occurred several months ago as well and he went to the ed and was placed on blood pressure and cholesterol medications. Pt is aox4 and ambulatory.

## 2020-11-02 NOTE — ED Provider Notes (Signed)
[name]    CSN: [id number] Arrival date & time: 11/02/20  1028      History   Chief Complaint Chief Complaint  Patient presents with  . Weakness    worsening since saturday  . Emesis    4 episodes since saturday    HPI John Estes is a 44 y.o. male.   HPI   Patient is here for nausea, vomiting, some loose bowels, weakness.  No fever or chills.  No headache or body aches.  He states that he has episodes like this.  He has had them in the past.  He has been in the emergency room a couple of different times.  He is uncertain whether it is triggered by illness like a stomach flu, or by anxiety and stress.  He states that he does have severe anxiety and will throw up when he is upset.  Does not identify any stress triggers at this time.  He states he has been inconsistent in taking his Zoloft daily.  He did wake up this morning quite anxious at 3 AM and took a Xanax.  He states that within 30 minutes he was having nausea and some vomiting.  Patient is keeping down some water. He is here specifically requesting IV fluids.  States he feels very weak. He also states that he is having extra heartburn and GI distress.  Past Medical History:  Diagnosis Date  . Anxiety   . Depression     Patient Active Problem List   Diagnosis Date Noted  . Recurrent major depression (HCC) 09/19/2018  . 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 Anxiety disorder 11/26/2012  . Depressive disorder 11/26/2012    Past Surgical History:  Procedure Laterality Date  . APPENDECTOMY         Home Medications    Prior to Admission medications   Medication Sig Start Date End Date Taking? Authorizing Provider  ALPRAZolam Prudy Feeler) 1 MG tablet TAKE 1 TABLET BY MOUTH TWICE DAILY AS NEEDED 07/05/20  Yes Hurst, Teresa T, PA-C  atorvastatin (LIPITOR) 20 MG tablet Take 20 mg by mouth daily.   Yes [provider]  lisinopril (ZESTRIL) 20 MG tablet Take 20 mg by  mouth daily.   Yes [provider]  sertraline (ZOLOFT) 100 MG tablet Take 2 tablets (200 mg total) by mouth daily. 07/05/20  Yes Hurst, Glade Nurse, PA-C  traZODone (DESYREL) 50 MG tablet Take 1-2 tablets (50-100 mg total) by mouth at bedtime as needed for sleep. 07/05/20  Yes Cherie Ouch, PA-C    Family History Family History  Problem Relation Age of Onset  . Depression Sister   . Alcohol abuse Maternal Grandfather     Social History Social History   Tobacco Use  . Smoking status: Current Every Day Smoker    Packs/day: 1.00    Types: Cigarettes  . Smokeless tobacco: Never Used  . Tobacco comment: Patches, gum, Wellbutrin didn't work  Advertising account planner  . Vaping Use: Never used  Substance Use Topics  . Alcohol use: No  . Drug use: No     Allergies   Penicillins   Review of Systems Review of Systems See HPI  Physical Exam Triage Vital Signs ED Triage Vitals  Enc Vitals Group     BP 11/02/20 1042 (!) 148/95     Pulse Rate 11/02/20 1042 90     Resp 11/02/20 1042 18     Temp 11/02/20 1042 98.4 F (36.9 C)  Temp Source 11/02/20 1042 Oral     SpO2 11/02/20 1042 98 %     Weight --      Height --      Head Circumference --      Peak Flow --      Pain Score 11/02/20 1045 0     Pain Loc --      Pain Edu? --      Excl. in GC? --    No data found.  Updated Vital Signs BP (!) 148/95 (BP Location: Left Arm)   Pulse 90   Temp 98.4 F (36.9 C) (Oral)   Resp 18   SpO2 98%      Physical Exam Constitutional:      General: He is not in acute distress.    Appearance: He is well-developed. He is obese.     Comments: Appears tired  HENT:     Head: Normocephalic and atraumatic.     Right Ear: Tympanic membrane, ear canal and external ear normal.     Left Ear: Tympanic membrane, ear canal and external ear normal.     Nose: Nose normal.     Mouth/Throat:     Mouth: Mucous membranes are moist.     Comments: Mask is in place Eyes:     Conjunctiva/sclera:  Conjunctivae normal.     Pupils: Pupils are equal, round, and reactive to light.  Cardiovascular:     Rate and Rhythm: Normal rate and regular rhythm.     Heart sounds: Normal heart sounds.  Pulmonary:     Effort: Pulmonary effort is normal. No respiratory distress.     Breath sounds: Normal breath sounds.     Comments: Heart and lung exam is normal Abdominal:     General: There is no distension.     Palpations: Abdomen is soft.     Tenderness: There is abdominal tenderness.     Comments: Tenderness mid epigastrium  Musculoskeletal:        General: Normal range of motion.     Cervical back: Normal range of motion.  Skin:    General: Skin is warm and dry.  Neurological:     Mental Status: He is alert.  Psychiatric:        Behavior: Behavior normal.      UC Treatments / Results  Labs (all labs ordered are listed, but only abnormal results are displayed) Labs Reviewed - No data to display  EKG   Radiology No results found.  Procedures Procedures (including critical care time)  Medications Ordered in UC Medications  ondansetron (ZOFRAN-ODT) disintegrating tablet 4 mg (4 mg Oral Given 11/02/20 1120)  alum & mag hydroxide-simeth (MAALOX/MYLANTA) 200-200-20 MG/5ML suspension 30 mL (30 mLs Oral Given 11/02/20 1120)    Initial Impression / Assessment and Plan / UC Course  I have reviewed the triage vital signs and the nursing notes.  Pertinent labs & imaging results that were available during my care of the patient were reviewed by me and considered in my medical decision making (see chart for details).     Patient had improvement with the antiacid and the Zofran.  He was able to keep down a glass of water.  He was told to go home and orally rehydrate.  His EKG is normal.  I reassured him that his chest pain is from his stomach. I am putting him on omeprazole daily for the GERD symptoms.  I am giving him Zofran for nausea I am advising him to  take his Zoloft daily instead  of as needed.  Dose will help better help with his anxiety I am referring him back to his primary care doctor for additional management Final Clinical Impressions(s) / UC Diagnoses   Final diagnoses:  None   Discharge Instructions   None    ED Prescriptions    None     PDMP not reviewed this encounter.   Eustace Moore, MD 11/02/20 1225

## 2020-11-02 NOTE — Discharge Instructions (Signed)
Make sure you take your Zoloft once a day. Take it every morning with your blood pressure medication. Take omeprazole once a day. This will reduce your stomach acid and help with your acid reflux and heartburn symptoms Take ondansetron for nausea and vomiting. This is important so you can keep down some fluids You need to rehydrate with plenty of fluids. Water, Gatorade, drinks without caffeine and sugar are the best choices Follow-up with your primary care doctor if not improving by the end of the week

## 2021-01-10 ENCOUNTER — Other Ambulatory Visit: Payer: Self-pay | Admitting: Physician Assistant

## 2021-02-16 ENCOUNTER — Other Ambulatory Visit: Payer: Self-pay | Admitting: Physician Assistant

## 2021-02-16 NOTE — Telephone Encounter (Signed)
Controlled substance 

## 2021-02-22 ENCOUNTER — Other Ambulatory Visit: Payer: Self-pay | Admitting: Physician Assistant

## 2021-02-22 NOTE — Telephone Encounter (Signed)
04/29/21 

## 2021-02-23 NOTE — Telephone Encounter (Signed)
Controlled substance 

## 2021-04-29 ENCOUNTER — Ambulatory Visit (INDEPENDENT_AMBULATORY_CARE_PROVIDER_SITE_OTHER): Payer: 59 | Admitting: Physician Assistant

## 2021-04-29 ENCOUNTER — Other Ambulatory Visit: Payer: Self-pay

## 2021-04-29 ENCOUNTER — Encounter: Payer: Self-pay | Admitting: Physician Assistant

## 2021-04-29 ENCOUNTER — Other Ambulatory Visit: Payer: Self-pay | Admitting: Physician Assistant

## 2021-04-29 VITALS — BP 160/103 | HR 74

## 2021-04-29 DIAGNOSIS — F3341 Major depressive disorder, recurrent, in partial remission: Secondary | ICD-10-CM | POA: Diagnosis not present

## 2021-04-29 DIAGNOSIS — F4010 Social phobia, unspecified: Secondary | ICD-10-CM

## 2021-04-29 DIAGNOSIS — F1021 Alcohol dependence, in remission: Secondary | ICD-10-CM

## 2021-04-29 DIAGNOSIS — F17200 Nicotine dependence, unspecified, uncomplicated: Secondary | ICD-10-CM

## 2021-04-29 MED ORDER — SERTRALINE HCL 100 MG PO TABS: 200.0000 mg | ORAL_TABLET | Freq: Every day | ORAL | 5 refills | Status: DC

## 2021-04-29 MED ORDER — PROPRANOLOL HCL 20 MG PO TABS: 20.0000 mg | ORAL_TABLET | Freq: Three times a day (TID) | ORAL | 1 refills | Status: DC | PRN

## 2021-04-29 MED ORDER — ALPRAZOLAM 0.5 MG PO TABS: ORAL_TABLET | ORAL | 0 refills | Status: DC

## 2021-04-29 NOTE — Progress Notes (Signed)
[facility]  Patient ID: John Estes,  MRN: [id number]  PCP: Josephina Gip, NP  Date of Evaluation: 04/29/2021 Time spent:40 minutes  Chief Complaint:  Chief Complaint    Anxiety; Depression; Alcohol Problem; Follow-up      HISTORY/CURRENT STATUS: HPI For routine med check. 3 months over due.  Had a " bout with alcohol for about 6 months."  He has been grieving because 2 co-workers died. He's had a lot of stress at work, not just because of their death, but the work environment itself.  Also since the last visit, had an absessed tooth and had emergency oral surgery where 9 teeth were pulled, and he and his sister don't have a great relationship still. So all these things led to him to drinking again.  He never had any problems with drinking at work or while driving.  He has not had any alcohol at all since sometime in March.  When he went to the ER for the alcohol withdrawal, 11/04/2020 he was prescribed gabapentin.  He states it has not helped so he stopped it.  But he is not really sure why it was given in the first place.  Complaining that he went to ER and 'they didn't do anything for me. They kicked me out of the ER after 9 hours.' He'd been vomiting, couldn't keep anything down. After further questioning, he was given IVF, recommended he go to an in patient detox.  He refused that so went home instead.  He is able to enjoy some things now.  Energy and motivation are good and he is working full-time is always.  Still stressful at work but not as bad.  Not isolating.  No suicidal or homicidal thoughts.  His anxiety through all of these situations has really increased.  States he has not been taking more Xanax than he is allowed.  He has been on Xanax for 20+ years.  He has generalized anxiety, worries a lot about many different things.  But he also has panic attacks several times a week, associated with shortness of breath, sweating, hyperventilating, chest tightness and  palpitations.  They can last anywhere from a few minutes to a couple of hours.  Xanax has always been helpful.  Patient denies increased energy with decreased need for sleep, no increased talkativeness, no racing thoughts, no impulsivity or risky behaviors, no increased spending, no increased libido, no grandiosity, no increased irritability or anger, and no hallucinations.  Denies dizziness, syncope, seizures, numbness, tingling, tremor, tics, unsteady gait, slurred speech, confusion. Denies muscle or joint pain, stiffness, or dystonia.  Individual Medical History/ Review of Systems: Changes? :Yes  see HPI.   Past medications for mental health diagnoses include: Prozac, hydroxyzine, Gabapentin after ER visit for alcohol withdrawal.  Unclear how long he took that medication but he stopped it on his own.  Allergies: Penicillins  Current Medications:  Current Outpatient Medications:  .  ALPRAZolam (XANAX) 0.5 MG tablet, 1 po tid and an extra 1/2 during the day for 2 weeks, then 1 po tid., Disp: 105 tablet, Rfl: 0 .  atorvastatin (LIPITOR) 20 MG tablet, Take 20 mg by mouth daily., Disp: , Rfl:  .  lisinopril (ZESTRIL) 20 MG tablet, Take 20 mg by mouth daily., Disp: , Rfl:  .  propranolol (INDERAL) 20 MG tablet, Take 1 tablet (20 mg total) by mouth 3 (three) times daily as needed., Disp: 90 tablet, Rfl: 1 .  omeprazole (PRILOSEC) 20 MG capsule, Take 1 capsule (20 mg  total) by mouth daily. (Patient not taking: Reported on 04/29/2021), Disp: 30 capsule, Rfl: 0 .  ondansetron (ZOFRAN) 8 MG tablet, Take 1 tablet (8 mg total) by mouth every 8 (eight) hours as needed for nausea or vomiting. (Patient not taking: Reported on 04/29/2021), Disp: 20 tablet, Rfl: 0 .  sertraline (ZOLOFT) 100 MG tablet, Take 2 tablets (200 mg total) by mouth daily., Disp: 60 tablet, Rfl: 5 .  traZODone (DESYREL) 50 MG tablet, Take 1-2 tablets (50-100 mg total) by mouth at bedtime as needed for sleep. (Patient not taking: Reported  on 04/29/2021), Disp: 60 tablet, Rfl: 5 Medication Side Effects: none  Family Medical/ Social History: Changes? See HPI  MENTAL HEALTH EXAM:  Blood pressure (!) 160/103, pulse 74.There is no height or weight on file to calculate BMI.  General Appearance: Casual and Fairly Groomed  Eye Contact:  Good  Speech:  Clear and Coherent and Normal Rate  Volume:  Normal  Mood:  Anxious  Affect:  Congruent and Anxious  Thought Process:  Goal Directed and Descriptions of Associations: Circumstantial  Orientation:  Full (Time, Place, and Person)  Thought Content: Obsessions and Rumination   Suicidal Thoughts:  No  Homicidal Thoughts:  No  Memory:  WNL  Judgement:  Good  Insight:  Good  Psychomotor Activity:  Normal  Concentration:  Concentration: Good  Recall:  Good  Fund of Knowledge: Good  Language: Good  Assets:  Desire for Improvement  ADL's:  Intact  Cognition: WNL  Prognosis:  Good    DIAGNOSES:    ICD-10-CM   1. Alcohol use disorder, moderate, in early remission (HCC)  F10.21   2. Social anxiety disorder  F40.10   3. Recurrent major depressive disorder, in partial remission (HCC)  F33.41   4. Smoker  F17.200     Receiving Psychotherapy: No    RECOMMENDATIONS:  PDMP was reviewed. I provided 40  minutes of face to face time during this encounter, including time spent before and after the visit in records review, medical decision making, and charting.  We discussed a problem with alcohol, and even though he has quit, I do not want to keep him on Xanax or any other benzo as it can cause cravings to recur.  And mixing the 2 is absolutely inappropriate.  Since he has been on the Xanax for so long, it may be difficult to wean him off so I will go very slowly, but if there is any concern of drinking again I will have to get him off quicker.   He admits that he is terrified of going off the Xanax because he has been on it for so long.  We discussed different options such as  propranolol, BuSpar, increasing Zoloft.  Because he does have physical symptoms such as palpitations, sweating, and tachycardia, I recommend using propranolol as needed.  I discussed the benefits, risks, and side effects and he accepts. Did not discuss acamprosate, he feels like anything he takes "in that family" will make him sick, he does not feel he needs another medication. Briefly discussed smoking cessation.  But, one thing at a time. He is currently on a total of 2 mg Xanax per day so will wean as follows: Decrease Xanax by changing the pill to 0.5 mg and he will take 1 p.o. 3 times daily and an extra half during the day (1.75 mg) for 2 weeks and then decrease to 1 p.o. 3 times daily (1.5 mg daily) Start propranolol 20 mg, 1  p.o. 3 times daily as needed anxiety.  Continue Zoloft 100 mg, 2 p.o. daily. Continue trazodone 50 mg 1-2 nightly as needed sleep.  He rarely takes. Start Thiamine OTC. Recommend counseling. Return in 1 month.  Melony Overly, PA-C

## 2021-05-01 ENCOUNTER — Encounter: Payer: Self-pay | Admitting: Physician Assistant

## 2021-05-31 ENCOUNTER — Telehealth: Payer: Self-pay | Admitting: Physician Assistant

## 2021-05-31 ENCOUNTER — Ambulatory Visit: Payer: 59 | Admitting: Physician Assistant

## 2021-05-31 ENCOUNTER — Other Ambulatory Visit: Payer: Self-pay | Admitting: Psychiatry

## 2021-05-31 MED ORDER — ALPRAZOLAM 0.5 MG PO TABS: 0.5000 mg | ORAL_TABLET | Freq: Three times a day (TID) | ORAL | 0 refills | Status: DC | PRN

## 2021-05-31 NOTE — Telephone Encounter (Signed)
Rosey Bath is out today please review

## 2021-05-31 NOTE — Telephone Encounter (Signed)
Pt informed

## 2021-05-31 NOTE — Telephone Encounter (Signed)
Romualdo was scheduled today as a work in to discuss further coming off the Xanax.  Rosey Bath had prescribed #105 with to start coming off.  The pharmacy only gave his #90 because it is controlled and they weren't allowed to give him more than #90.  So he is running low and needs to further discuss the titrate down.  He would prefer for the refill that he needs now be #60 at the 1 mg he was on before.  Then he can bring in what he has left over.  We RS him for 7/14.  Pharmacy is [facility] on [address] in [place].  Please let him know is decided about his refill.

## 2021-05-31 NOTE — Telephone Encounter (Signed)
John Estes last saw the patient on April 29, 2021.  Because of his alcohol abuse which was being used in combination with the Xanax she recommended tapering the Xanax.  Also Xanax and similar medications can trigger alcohol craving and patients that have alcohol abuse and she wanted to reduce the dose for that reason as well.  He should now be on Xanax 0.5 mg 3 times daily.  I will send in a 1 week supply of this dosage.  He can discuss this further with Aggie Cosier when she returns.

## 2021-06-06 NOTE — Telephone Encounter (Signed)
Noted  

## 2021-06-09 ENCOUNTER — Other Ambulatory Visit: Payer: Self-pay | Admitting: Physician Assistant

## 2021-06-09 ENCOUNTER — Telehealth: Payer: Self-pay | Admitting: Physician Assistant

## 2021-06-09 ENCOUNTER — Ambulatory Visit: Payer: 59 | Admitting: Physician Assistant

## 2021-06-09 MED ORDER — ALPRAZOLAM 0.5 MG PO TABS: 0.5000 mg | ORAL_TABLET | Freq: Three times a day (TID) | ORAL | 0 refills | Status: DC | PRN

## 2021-06-09 NOTE — Telephone Encounter (Signed)
John Estes was to see John Estes today.  We RS him to next Tuesday as a work in because he needs to see her.  He wasn't available to see anyone else this week due to work issues and times available.  He does need a refill of his Xanax because the last prescription was just for 1 week. So he'll need another week hoping he will be able to see [name] Tuesday.  He said the pharmacy needs to know it is okay to fill just the 1 week.  They seems to be concerned about it last time.  Pharmacy is [facility] on [address]. In [place].

## 2021-06-09 NOTE — Telephone Encounter (Signed)
Please review

## 2021-06-09 NOTE — Telephone Encounter (Signed)
Please let him know I sent in a 1 wk supply of the Xanax, same directions as last wk.  I put a note to the pharmacist the reason for only doing 1 week last week and this week.  Thanks.

## 2021-06-09 NOTE — Telephone Encounter (Signed)
Pt informed

## 2021-06-14 ENCOUNTER — Ambulatory Visit: Payer: 59 | Admitting: Physician Assistant

## 2021-06-16 ENCOUNTER — Other Ambulatory Visit: Payer: Self-pay | Admitting: Physician Assistant

## 2021-06-16 ENCOUNTER — Telehealth: Payer: Self-pay | Admitting: Physician Assistant

## 2021-06-16 MED ORDER — ALPRAZOLAM 0.5 MG PO TABS: 0.5000 mg | ORAL_TABLET | Freq: Three times a day (TID) | ORAL | 0 refills | Status: DC | PRN

## 2021-06-16 NOTE — Telephone Encounter (Signed)
Prescription was sent

## 2021-06-16 NOTE — Telephone Encounter (Signed)
Please send

## 2021-06-16 NOTE — Telephone Encounter (Signed)
Pt left a message that he needs  a refill on his xanax. He gets one week at a time. His next appt 7/27. Pharmacy is [facility] [address] in [place]

## 2021-06-21 ENCOUNTER — Encounter: Payer: Self-pay | Admitting: Physician Assistant

## 2021-06-21 ENCOUNTER — Other Ambulatory Visit: Payer: Self-pay

## 2021-06-21 ENCOUNTER — Ambulatory Visit (INDEPENDENT_AMBULATORY_CARE_PROVIDER_SITE_OTHER): Payer: 59 | Admitting: Physician Assistant

## 2021-06-21 VITALS — BP 142/92 | HR 84

## 2021-06-21 DIAGNOSIS — F4010 Social phobia, unspecified: Secondary | ICD-10-CM | POA: Diagnosis not present

## 2021-06-21 DIAGNOSIS — F17200 Nicotine dependence, unspecified, uncomplicated: Secondary | ICD-10-CM | POA: Diagnosis not present

## 2021-06-21 DIAGNOSIS — F3341 Major depressive disorder, recurrent, in partial remission: Secondary | ICD-10-CM

## 2021-06-21 DIAGNOSIS — G4700 Insomnia, unspecified: Secondary | ICD-10-CM | POA: Diagnosis not present

## 2021-06-21 DIAGNOSIS — F1021 Alcohol dependence, in remission: Secondary | ICD-10-CM

## 2021-06-21 MED ORDER — ALPRAZOLAM 0.5 MG PO TABS: 0.5000 mg | ORAL_TABLET | Freq: Two times a day (BID) | ORAL | 0 refills | Status: DC | PRN

## 2021-06-21 MED ORDER — BUSPIRONE HCL 15 MG PO TABS: ORAL_TABLET | ORAL | 1 refills | Status: DC
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
## 2021-06-30 ENCOUNTER — Telehealth: Payer: Self-pay | Admitting: Physician Assistant

## 2021-06-30 NOTE — Telephone Encounter (Signed)
7 day supply was sent on 7/27

## 2021-06-30 NOTE — Telephone Encounter (Signed)
Pt is requesting next RF of Xanax. To: [facility] #[id number] - [place], [place] - [address]

## 2021-07-01 ENCOUNTER — Other Ambulatory Visit: Payer: Self-pay | Admitting: Physician Assistant

## 2021-07-01 MED ORDER — ALPRAZOLAM 0.25 MG PO TABS: 0.2500 mg | ORAL_TABLET | Freq: Three times a day (TID) | ORAL | 0 refills | Status: DC | PRN

## 2021-07-01 NOTE — Telephone Encounter (Signed)
Prescription was sent

## 2021-07-15 ENCOUNTER — Other Ambulatory Visit: Payer: Self-pay

## 2021-07-15 ENCOUNTER — Telehealth: Payer: Self-pay | Admitting: Physician Assistant

## 2021-07-15 MED ORDER — ALPRAZOLAM 0.25 MG PO TABS: 0.2500 mg | ORAL_TABLET | Freq: Three times a day (TID) | ORAL | 0 refills | Status: DC | PRN

## 2021-07-15 NOTE — Telephone Encounter (Signed)
Pt called requesting Xanax refill for weekly bases. Pt contact # [phone] if needed

## 2021-07-15 NOTE — Telephone Encounter (Signed)
Pended.

## 2021-07-19 ENCOUNTER — Other Ambulatory Visit: Payer: Self-pay | Admitting: Physician Assistant

## 2021-07-20 ENCOUNTER — Ambulatory Visit: Payer: 59 | Admitting: Physician Assistant

## 2021-07-21 ENCOUNTER — Other Ambulatory Visit: Payer: Self-pay

## 2021-07-21 ENCOUNTER — Ambulatory Visit (INDEPENDENT_AMBULATORY_CARE_PROVIDER_SITE_OTHER): Payer: 59 | Admitting: Physician Assistant

## 2021-07-21 DIAGNOSIS — F3341 Major depressive disorder, recurrent, in partial remission: Secondary | ICD-10-CM

## 2021-07-21 DIAGNOSIS — F17200 Nicotine dependence, unspecified, uncomplicated: Secondary | ICD-10-CM | POA: Diagnosis not present

## 2021-07-21 DIAGNOSIS — F4010 Social phobia, unspecified: Secondary | ICD-10-CM

## 2021-07-21 DIAGNOSIS — G4700 Insomnia, unspecified: Secondary | ICD-10-CM | POA: Diagnosis not present

## 2021-07-21 MED ORDER — ALPRAZOLAM 0.25 MG PO TABS: 0.2500 mg | ORAL_TABLET | Freq: Three times a day (TID) | ORAL | 3 refills | Status: DC | PRN

## 2021-07-21 NOTE — Progress Notes (Signed)
[facility]  Patient ID: John Estes,  MRN: [id number]  PCP: Josephina Gip, NP  Date of Evaluation: 07/21/2021 Time spent:40 minutes  Chief Complaint:  Chief Complaint   Anxiety; Depression; Insomnia; Follow-up     HISTORY/CURRENT STATUS: HPI For med check after decreasing Xanax.  Had to go to ER 07/15/2021 b/c infection. Has had 3 oral surgeries since LOV. Having to get all teeth pulled, more difficult b/c veneers, and will get dentures. Has needed the Xanax a little more the past few weeks b/c of this. He's been doing really well with the slow wean, even having 1 Rx that should have been gone in a week lasted around 2 weeks. With all the dental work, he's been more panicky. But generalized angst is better controlled. Has had a hard time sleeping b/c pain. Denies any alcohol use.   We increased the Zoloft 1 month ago.  He feels like that has been helping depression and the anxiety.  He stopped the BuSpar that we had started a month ago because it made him feel funny.  No extreme sadness, tearfulness, or feelings of hopelessness.  Denies any changes in concentration, making decisions or remembering things.  Denies suicidal or homicidal thoughts.  Patient denies increased energy with decreased need for sleep, no increased talkativeness, no racing thoughts, no impulsivity or risky behaviors, no increased spending, no increased libido, no grandiosity, no increased irritability or anger, and no hallucinations.  Review of Systems  Constitutional: Negative.   HENT:         See HPI  Eyes: Negative.   Respiratory: Negative.    Cardiovascular: Negative.   Gastrointestinal: Negative.   Genitourinary: Negative.   Musculoskeletal: Negative.   Skin: Negative.   Neurological: Negative.   Endo/Heme/Allergies: Negative.   Psychiatric/Behavioral:         See HPI   Individual Medical History/ Review of Systems: Changes? :Yes  see HPI.   Past medications for mental health  diagnoses include: Prozac, hydroxyzine, Gabapentin after ER visit for alcohol withdrawal.  Unclear how long he took that medication but he stopped it on his own, Trazodone. Xanax since around 45 yo.  BuSpar made him feel "funny."  Allergies: Penicillins  Current Medications:  Current Outpatient Medications:    atorvastatin (LIPITOR) 20 MG tablet, Take 20 mg by mouth daily., Disp: , Rfl:    lisinopril (ZESTRIL) 20 MG tablet, Take 20 mg by mouth daily., Disp: , Rfl:    omeprazole (PRILOSEC) 20 MG capsule, Take 1 capsule (20 mg total) by mouth daily., Disp: 30 capsule, Rfl: 0   propranolol (INDERAL) 20 MG tablet, Take 1 tablet (20 mg total) by mouth 3 (three) times daily as needed., Disp: 90 tablet, Rfl: 1   sertraline (ZOLOFT) 100 MG tablet, Take 2 tablets (200 mg total) by mouth daily., Disp: 60 tablet, Rfl: 5   traZODone (DESYREL) 50 MG tablet, TAKE 1 TO 2 TABLETS(50 TO 100 MG) BY MOUTH AT BEDTIME AS NEEDED FOR SLEEP, Disp: 60 tablet, Rfl: 0   ALPRAZolam (XANAX) 0.25 MG tablet, Take 1 tablet (0.25 mg total) by mouth 3 (three) times daily as needed for anxiety. Must last 7 days., Disp: 21 tablet, Rfl: 3   ondansetron (ZOFRAN) 8 MG tablet, Take 1 tablet (8 mg total) by mouth every 8 (eight) hours as needed for nausea or vomiting. (Patient not taking: No sig reported), Disp: 20 tablet, Rfl: 0 Medication Side Effects: none  Family Medical/ Social History: Changes? no  MENTAL HEALTH EXAM:  There were no vitals taken for this visit.There is no height or weight on file to calculate BMI.  General Appearance: Casual and Fairly Groomed  Eye Contact:  Good  Speech:  Clear and Coherent and Normal Rate  Volume:  Normal  Mood:  Anxious  Affect:  Congruent  Thought Process:  Goal Directed and Descriptions of Associations: Circumstantial  Orientation:  Full (Time, Place, and Person)  Thought Content: Logical   Suicidal Thoughts:  No  Homicidal Thoughts:  No  Memory:  WNL  Judgement:  Good   Insight:  Good  Psychomotor Activity:  Normal  Concentration:  Concentration: Good  Recall:  Good  Fund of Knowledge: Good  Language: Good  Assets:  Desire for Improvement  ADL's:  Intact  Cognition: WNL  Prognosis:  Good    DIAGNOSES:    ICD-10-CM   1. Social anxiety disorder  F40.10     2. Recurrent major depressive disorder, in partial remission (HCC)  F33.41     3. Insomnia, unspecified type  G47.00     4. Smoker  F17.200        Receiving Psychotherapy: No    RECOMMENDATIONS:  PDMP was reviewed. Last Xanax 07/15/2021 I provided 40 minutes of face to face time during this encounter, including time spent before and after the visit in records review, medical decision making, counseling pertinent to today's visit, and charting.  Because of the tremendous amount of stress he's under and the fact that he isn't drinking now, I recommend staying at same dose of Xanax for now.  We agreed to 1 week supply of the Xanax at the time and at the next visit continue to wean. Smoking cessation was discussed. Discontinue BuSpar.  He already has. Continue Xanax 0.25 mg, 1 tid prn.  Continue propranolol 20 mg, 1 p.o. 3 times daily as needed anxiety.  Continue Zoloft 100 mg, 2 p.o. daily. Continue trazodone 50 mg 1-2 nightly as needed sleep.  He rarely takes. Return in 1 month.  Melony Overly, PA-C

## 2021-07-27 ENCOUNTER — Encounter: Payer: Self-pay | Admitting: Physician Assistant

## 2021-08-19 ENCOUNTER — Ambulatory Visit (INDEPENDENT_AMBULATORY_CARE_PROVIDER_SITE_OTHER): Payer: 59 | Admitting: Physician Assistant

## 2021-08-19 ENCOUNTER — Encounter: Payer: Self-pay | Admitting: Physician Assistant

## 2021-08-19 ENCOUNTER — Other Ambulatory Visit: Payer: Self-pay

## 2021-08-19 DIAGNOSIS — F418 Other specified anxiety disorders: Secondary | ICD-10-CM | POA: Diagnosis not present

## 2021-08-19 DIAGNOSIS — F3341 Major depressive disorder, recurrent, in partial remission: Secondary | ICD-10-CM

## 2021-08-19 DIAGNOSIS — G4700 Insomnia, unspecified: Secondary | ICD-10-CM | POA: Diagnosis not present

## 2021-08-19 MED ORDER — ALPRAZOLAM 0.25 MG PO TABS: 0.2500 mg | ORAL_TABLET | Freq: Three times a day (TID) | ORAL | 3 refills | Status: DC | PRN

## 2021-08-19 NOTE — Progress Notes (Signed)
[facility]  Patient ID: John Estes,  MRN: [id number]  PCP: Josephina Gip, NP  Date of Evaluation: 08/19/2021 Time spent:20 minutes  Chief Complaint:  Chief Complaint   Anxiety; Depression     HISTORY/CURRENT STATUS: HPI For f/u med check.   Frustrated concerning dental work. Keeps getting the run around between dentist and oral surgeon, pharmacy won't fill the xanax on day 7, although he is making it last 9-10 days sometimes.  States even though the anxiety is better it makes him anxious not knowing when the pharmacy will let him get the Xanax filled.  Denies any alcohol use at all.  Work is going well.  He is able to enjoy things some now that he is feeling some better physically.  Energy and motivation are good.  Not isolating.  Appetite is normal and weight is stable.  No suicidal or homicidal thoughts.  Patient denies increased energy with decreased need for sleep, no increased talkativeness, no racing thoughts, no impulsivity or risky behaviors, no increased spending, no increased libido, no grandiosity, no increased irritability or anger, and no hallucinations.  Denies dizziness, syncope, seizures, numbness, tingling, tremor, tics, unsteady gait, slurred speech, confusion. Denies muscle or joint pain, stiffness, or dystonia.  Individual Medical History/ Review of Systems: Changes? :Yes  see HPI.   Past medications for mental health diagnoses include: Prozac, hydroxyzine, Gabapentin after ER visit for alcohol withdrawal.  Unclear how long he took that medication but he stopped it on his own, Trazodone. Xanax since around 45 yo.  BuSpar made him feel "funny." Propranolol didn't work.   Allergies: Penicillins  Current Medications:  Current Outpatient Medications:    atorvastatin (LIPITOR) 20 MG tablet, Take 20 mg by mouth daily., Disp: , Rfl:    lisinopril (ZESTRIL) 20 MG tablet, Take 20 mg by mouth daily., Disp: , Rfl:    sertraline (ZOLOFT) 100 MG tablet,  Take 2 tablets (200 mg total) by mouth daily., Disp: 60 tablet, Rfl: 5   ALPRAZolam (XANAX) 0.25 MG tablet, Take 1 tablet (0.25 mg total) by mouth 3 (three) times daily as needed for anxiety. Must last 7 days., Disp: 21 tablet, Rfl: 3   omeprazole (PRILOSEC) 20 MG capsule, Take 1 capsule (20 mg total) by mouth daily. (Patient not taking: Reported on 08/19/2021), Disp: 30 capsule, Rfl: 0   ondansetron (ZOFRAN) 8 MG tablet, Take 1 tablet (8 mg total) by mouth every 8 (eight) hours as needed for nausea or vomiting. (Patient not taking: No sig reported), Disp: 20 tablet, Rfl: 0   propranolol (INDERAL) 20 MG tablet, Take 1 tablet (20 mg total) by mouth 3 (three) times daily as needed. (Patient not taking: Reported on 08/19/2021), Disp: 90 tablet, Rfl: 1   traZODone (DESYREL) 50 MG tablet, TAKE 1 TO 2 TABLETS(50 TO 100 MG) BY MOUTH AT BEDTIME AS NEEDED FOR SLEEP (Patient not taking: Reported on 08/19/2021), Disp: 60 tablet, Rfl: 0 Medication Side Effects: none  Family Medical/ Social History: Changes? no  MENTAL HEALTH EXAM:  There were no vitals taken for this visit.There is no height or weight on file to calculate BMI.  General Appearance: Casual and Fairly Groomed  Eye Contact:  Good  Speech:  Clear and Coherent and Normal Rate  Volume:  Normal  Mood:  Anxious and Irritable  Affect:  Congruent and Anxious  Thought Process:  Goal Directed and Descriptions of Associations: Circumstantial  Orientation:  Full (Time, Place, and Person)  Thought Content: Logical   Suicidal Thoughts:  No  Homicidal Thoughts:  No  Memory:  WNL  Judgement:  Good  Insight:  Good  Psychomotor Activity:  Normal  Concentration:  Concentration: Good  Recall:  Good  Fund of Knowledge: Good  Language: Good  Assets:  Desire for Improvement  ADL's:  Intact  Cognition: WNL  Prognosis:  Good    DIAGNOSES:    ICD-10-CM   1. Situational anxiety  F41.8     2. Insomnia, unspecified type  G47.00     3. Recurrent major  depressive disorder, in partial remission (HCC)  F33.41         Receiving Psychotherapy: No    RECOMMENDATIONS:  PDMP was reviewed. Last Xanax 08/15/2021 I provided 20 minutes of face to face time during this encounter, including time spent before and after the visit in records review, medical decision making, and charting.  He is doing well overall with the current dose of Xanax.  I still do not want to drop it anymore because of the anxiety with the dental work situation.  He is no longer drinking alcohol and I trust that so I am okay keeping him on this low dose of Xanax.  He prefers to keep it at weekly intervals though so we will continue to prescribe 1 week at a time. Continue Xanax 0.25 mg, 1 tid prn.  Continue Zoloft 100 mg, 2 p.o. daily. Return in 4 to 6 weeks.  Melony Overly, PA-C

## 2021-09-14 ENCOUNTER — Other Ambulatory Visit: Payer: Self-pay | Admitting: Physician Assistant

## 2021-09-22 ENCOUNTER — Other Ambulatory Visit: Payer: Self-pay | Admitting: Physician Assistant

## 2021-09-23 ENCOUNTER — Telehealth: Payer: Self-pay | Admitting: Physician Assistant

## 2021-09-23 NOTE — Telephone Encounter (Signed)
Send if appropriate 

## 2021-09-23 NOTE — Telephone Encounter (Signed)
Next visit is 09/27/21. John Estes called requesting a refill on his Xanax and Alprazolam called to:  [facility] #[id number] - [place], [place] - [address] AT [facility] [address]  Phone:  [phone]  Fax:  [phone]

## 2021-09-26 NOTE — Telephone Encounter (Signed)
Pended.

## 2021-09-26 NOTE — Telephone Encounter (Signed)
Filled 10/19

## 2021-09-27 ENCOUNTER — Encounter: Payer: Self-pay | Admitting: Physician Assistant

## 2021-09-27 ENCOUNTER — Ambulatory Visit (INDEPENDENT_AMBULATORY_CARE_PROVIDER_SITE_OTHER): Payer: 59 | Admitting: Physician Assistant

## 2021-09-27 ENCOUNTER — Other Ambulatory Visit: Payer: Self-pay

## 2021-09-27 DIAGNOSIS — F3341 Major depressive disorder, recurrent, in partial remission: Secondary | ICD-10-CM | POA: Diagnosis not present

## 2021-09-27 DIAGNOSIS — F4010 Social phobia, unspecified: Secondary | ICD-10-CM

## 2021-09-27 DIAGNOSIS — F17200 Nicotine dependence, unspecified, uncomplicated: Secondary | ICD-10-CM

## 2021-09-27 NOTE — Progress Notes (Signed)
[facility]  Patient ID: John Estes,  MRN: [id number]  PCP: Josephina Gip, NP  Date of Evaluation: 09/27/2021 Time spent:20 minutes  Chief Complaint:  Chief Complaint   Anxiety; Depression      HISTORY/CURRENT STATUS: HPI For f/u med check.   He is still doing really well since we cut down the Xanax. His last 4 week supply lasted 5 weeks. Still has anxiety, but there's always a known trigger. Still having some problems with oral health, but all that is supposed to be resolved soon, with all his teeth pulled and having implants. He does have anxiety, wondering whether something else might be wrong. But he's trusting the dentists who say the pain and fevers will go away once all the teeth are pulled. He's "pessimistically hopeful."  Doesn't have a lot of motivation but he works and doesn't miss. Patient denies loss of interest in usual activities and is able to enjoy things.  Appetite has not changed.  No extreme sadness, tearfulness, or feelings of hopelessness.  Denies any changes in concentration, making decisions or remembering things.  Sleeps well most of the time. Denies suicidal or homicidal thoughts.  Patient denies increased energy with decreased need for sleep, no increased talkativeness, no racing thoughts, no impulsivity or risky behaviors, no increased spending, no increased libido, no grandiosity, no increased irritability or anger, and no hallucinations.  Denies dizziness, syncope, seizures, numbness, tingling, tremor, tics, unsteady gait, slurred speech, confusion. Denies muscle or joint pain, stiffness, or dystonia.  Individual Medical History/ Review of Systems: Changes? :Yes  see HPI.   Past medications for mental health diagnoses include: Prozac, hydroxyzine, Gabapentin after ER visit for alcohol withdrawal.  Unclear how long he took that medication but he stopped it on his own, Trazodone. Xanax since around 45 yo.  BuSpar made him feel "funny."  Propranolol didn't work.   Allergies: Penicillins  Current Medications:  Current Outpatient Medications:    ALPRAZolam (XANAX) 0.25 MG tablet, TAKE 1 TABLET(0.25 MG) BY MOUTH THREE TIMES DAILY AS NEEDED FOR ANXIETY, Disp: 21 tablet, Rfl: 0   atorvastatin (LIPITOR) 20 MG tablet, Take 20 mg by mouth daily., Disp: , Rfl:    lisinopril (ZESTRIL) 20 MG tablet, Take 20 mg by mouth daily., Disp: , Rfl:    sertraline (ZOLOFT) 100 MG tablet, Take 2 tablets (200 mg total) by mouth daily., Disp: 60 tablet, Rfl: 5   traZODone (DESYREL) 50 MG tablet, TAKE 1 TO 2 TABLETS(50 TO 100 MG) BY MOUTH AT BEDTIME AS NEEDED FOR SLEEP, Disp: 60 tablet, Rfl: 0   omeprazole (PRILOSEC) 20 MG capsule, Take 1 capsule (20 mg total) by mouth daily. (Patient not taking: No sig reported), Disp: 30 capsule, Rfl: 0   ondansetron (ZOFRAN) 8 MG tablet, Take 1 tablet (8 mg total) by mouth every 8 (eight) hours as needed for nausea or vomiting. (Patient not taking: No sig reported), Disp: 20 tablet, Rfl: 0   propranolol (INDERAL) 20 MG tablet, Take 1 tablet (20 mg total) by mouth 3 (three) times daily as needed. (Patient not taking: No sig reported), Disp: 90 tablet, Rfl: 1 Medication Side Effects: none  Family Medical/ Social History: Changes? no  MENTAL HEALTH EXAM:  There were no vitals taken for this visit.There is no height or weight on file to calculate BMI.  General Appearance: Casual and Fairly Groomed  Eye Contact:  Good  Speech:  Clear and Coherent and Normal Rate  Volume:  Normal  Mood:  Anxious  Affect:  Anxious  Thought Process:  Goal Directed and Descriptions of Associations: Circumstantial  Orientation:  Full (Time, Place, and Person)  Thought Content: Logical   Suicidal Thoughts:  No  Homicidal Thoughts:  No  Memory:  WNL  Judgement:  Good  Insight:  Good  Psychomotor Activity:  Normal  Concentration:  Concentration: Good and Attention Span: Good  Recall:  Good  Fund of Knowledge: Good  Language: Good   Assets:  Desire for Improvement  ADL's:  Intact  Cognition: WNL  Prognosis:  Good    DIAGNOSES:    ICD-10-CM   1. Recurrent major depressive disorder, in partial remission (HCC)  F33.41     2. Social anxiety disorder  F40.10     3. Smoker  F17.200        Receiving Psychotherapy: No    RECOMMENDATIONS:  PDMP was reviewed.  Last Xanax filled 09/15/2021.  A 7-day supply is sometimes lasting up to 10 days.   I provided 20 minutes of face to face time during this encounter, including time spent before and after the visit in records review, medical decision making, and charting.  He's doing great so no changes in meds. He is not drinking, I trust him, so ok to Rx this low dose on Xanax.  We did discuss how well he has done weaning to this lower dose of Xanax when he never thought he would be able to decrease it.  Even with his physical health problems this year he has been able to stay the course. He still likes to be held accountable to the 21 pills/week, and to be seen every 4 to 6 weeks at least another time or 2 to see how he does after all the tooth extractions are done. Continue Xanax 0.25 mg, 1 tid prn.  Continue Zoloft 100 mg, 2 p.o. daily. Return in 4 to 6 weeks.  Melony Overly, PA-C

## 2021-10-03 ENCOUNTER — Other Ambulatory Visit: Payer: Self-pay | Admitting: Physician Assistant

## 2021-10-03 MED ORDER — ALPRAZOLAM 0.25 MG PO TABS: 0.2500 mg | ORAL_TABLET | Freq: Three times a day (TID) | ORAL | 3 refills | Status: DC | PRN

## 2021-10-03 NOTE — Telephone Encounter (Signed)
Pt called and asked for his weekly refill of xanax To be sent to the [facility] in [place]

## 2021-10-17 ENCOUNTER — Other Ambulatory Visit: Payer: Self-pay | Admitting: Physician Assistant

## 2021-11-02 ENCOUNTER — Encounter: Payer: Self-pay | Admitting: Physician Assistant

## 2021-11-02 ENCOUNTER — Other Ambulatory Visit: Payer: Self-pay

## 2021-11-02 ENCOUNTER — Ambulatory Visit (INDEPENDENT_AMBULATORY_CARE_PROVIDER_SITE_OTHER): Payer: 59 | Admitting: Physician Assistant

## 2021-11-02 ENCOUNTER — Ambulatory Visit: Payer: 59 | Admitting: Physician Assistant

## 2021-11-02 DIAGNOSIS — F4010 Social phobia, unspecified: Secondary | ICD-10-CM | POA: Diagnosis not present

## 2021-11-02 DIAGNOSIS — G4700 Insomnia, unspecified: Secondary | ICD-10-CM | POA: Diagnosis not present

## 2021-11-02 DIAGNOSIS — F17200 Nicotine dependence, unspecified, uncomplicated: Secondary | ICD-10-CM

## 2021-11-02 DIAGNOSIS — F418 Other specified anxiety disorders: Secondary | ICD-10-CM | POA: Diagnosis not present

## 2021-11-02 DIAGNOSIS — F1021 Alcohol dependence, in remission: Secondary | ICD-10-CM

## 2021-11-02 DIAGNOSIS — F3341 Major depressive disorder, recurrent, in partial remission: Secondary | ICD-10-CM | POA: Diagnosis not present

## 2021-11-02 MED ORDER — ALPRAZOLAM 0.25 MG PO TABS: 0.2500 mg | ORAL_TABLET | Freq: Three times a day (TID) | ORAL | 4 refills | Status: DC | PRN

## 2021-11-02 MED ORDER — SERTRALINE HCL 100 MG PO TABS: 200.0000 mg | ORAL_TABLET | Freq: Every day | ORAL | 5 refills | Status: DC

## 2021-11-02 NOTE — Progress Notes (Signed)
[facility]  Patient ID: John Estes,  MRN: [id number]  PCP: Josephina Gip, NP  Date of Evaluation: 11/02/2021. Time spent:30 minutes  Chief Complaint:  Chief Complaint   Anxiety; Depression; Insomnia; Follow-up     HISTORY/CURRENT STATUS: HPI For f/u med check.   He still doing very well with the decreased dose of Xanax. He's making a 7 day Rx last 9-10 days. He's not drinking any ETOH at all, since last Feb or March. He's very happy and doesn't have any complaints. If he doesn't take the Xanax when needed, he will get more and more antsy and might start to have a PA.  The Xanax helps prevent it from getting to that point.  Has had all his teeth pulled "finally."  Has temporary dentures right now.  Not able to eat very much because getting used to these dentures and having them adjusted every few days but he feels better physically.  Thinks that he had been feeling bad for a long time because of the chronic dental problems  Patient denies loss of interest in usual activities and is able to enjoy things.  Denies decreased energy or motivation.  Appetite has not changed.  No extreme sadness, tearfulness, or feelings of hopelessness.  Denies any changes in concentration, making decisions or remembering things.  Sleeps well and does not need the trazodone every night.  Denies suicidal or homicidal thoughts.  Patient denies increased energy with decreased need for sleep, no increased talkativeness, no racing thoughts, no impulsivity or risky behaviors, no increased spending, no increased libido, no grandiosity, no increased irritability or anger, and no hallucinations.  Denies dizziness, syncope, seizures, numbness, tingling, tremor, tics, unsteady gait, slurred speech, confusion. Denies muscle or joint pain, stiffness, or dystonia.  Individual Medical History/ Review of Systems: Changes? :Yes  see HPI.   Past medications for mental health diagnoses include: Prozac,  hydroxyzine, Gabapentin after ER visit for alcohol withdrawal.  Unclear how long he took that medication but he stopped it on his own, Trazodone. Xanax since around 45 yo.  BuSpar made him feel "funny." Propranolol didn't work.   Allergies: Penicillins  Current Medications:  Current Outpatient Medications:    atorvastatin (LIPITOR) 20 MG tablet, Take 20 mg by mouth daily., Disp: , Rfl:    lisinopril (ZESTRIL) 20 MG tablet, Take 20 mg by mouth daily., Disp: , Rfl:    traZODone (DESYREL) 50 MG tablet, TAKE 1 TO 2 TABLETS(50 TO 100 MG) BY MOUTH AT BEDTIME AS NEEDED FOR SLEEP, Disp: 60 tablet, Rfl: 0   ALPRAZolam (XANAX) 0.25 MG tablet, Take 1 tablet (0.25 mg total) by mouth 3 (three) times daily as needed for anxiety. Must last 7 days., Disp: 21 tablet, Rfl: 4   omeprazole (PRILOSEC) 20 MG capsule, Take 1 capsule (20 mg total) by mouth daily. (Patient not taking: Reported on 08/19/2021), Disp: 30 capsule, Rfl: 0   ondansetron (ZOFRAN) 8 MG tablet, Take 1 tablet (8 mg total) by mouth every 8 (eight) hours as needed for nausea or vomiting. (Patient not taking: Reported on 04/29/2021), Disp: 20 tablet, Rfl: 0   propranolol (INDERAL) 20 MG tablet, Take 1 tablet (20 mg total) by mouth 3 (three) times daily as needed. (Patient not taking: Reported on 08/19/2021), Disp: 90 tablet, Rfl: 1   sertraline (ZOLOFT) 100 MG tablet, Take 2 tablets (200 mg total) by mouth daily., Disp: 60 tablet, Rfl: 5 Medication Side Effects: none  Family Medical/ Social History: Changes? no  MENTAL HEALTH EXAM:  There were no vitals taken for this visit.There is no height or weight on file to calculate BMI.  General Appearance: Casual and Fairly Groomed  Eye Contact:  Good  Speech:  Clear and Coherent and Normal Rate  Volume:  Normal  Mood:  Euthymic  Affect:  Congruent  Thought Process:  Goal Directed and Descriptions of Associations: Circumstantial  Orientation:  Full (Time, Place, and Person)  Thought Content: Logical    Suicidal Thoughts:  No  Homicidal Thoughts:  No  Memory:  WNL  Judgement:  Good  Insight:  Good  Psychomotor Activity:  Normal  Concentration:  Concentration: Good and Attention Span: Good  Recall:  Good  Fund of Knowledge: Good  Language: Good  Assets:  Desire for Improvement  ADL's:  Intact  Cognition: WNL  Prognosis:  Good    DIAGNOSES:    ICD-10-CM   1. Social anxiety disorder  F40.10     2. Situational anxiety  F41.8     3. Recurrent major depressive disorder, in partial remission (HCC)  F33.41     4. Insomnia, unspecified type  G47.00     5. Smoker  F17.200     6. Alcoholism in recovery [facility])  F10.21         Receiving Psychotherapy: No    RECOMMENDATIONS:  PDMP was reviewed.  Last Xanax filled 10/25/2021. I provided 30 minutes of face to face time during this encounter, including time spent before and after the visit in records review, medical decision making, counseling pertinent to today's visit, and charting.  Smoking cessation discussed. Congratulations on no alcohol in almost a year now! Doing well with current Xanax dose, so no changes will be made.  We both want him to have only a 1 week supply at a time with refills and also close follow-up for at least 1 more appointment.  After that we may spread the visits out further. Continue Xanax 0.25 mg, 1 tid prn.  Continue Zoloft 100 mg, 2 p.o. daily. Continue trazodone 50 mg, 1-2 nightly as needed sleep. Return in 4 to 5 weeks.  Melony Overly, PA-C

## 2021-12-01 ENCOUNTER — Encounter: Payer: Self-pay | Admitting: Physician Assistant

## 2021-12-01 ENCOUNTER — Ambulatory Visit (INDEPENDENT_AMBULATORY_CARE_PROVIDER_SITE_OTHER): Payer: 59 | Admitting: Physician Assistant

## 2021-12-01 ENCOUNTER — Other Ambulatory Visit: Payer: Self-pay

## 2021-12-01 DIAGNOSIS — F4010 Social phobia, unspecified: Secondary | ICD-10-CM | POA: Diagnosis not present

## 2021-12-01 DIAGNOSIS — G4700 Insomnia, unspecified: Secondary | ICD-10-CM

## 2021-12-01 DIAGNOSIS — F17200 Nicotine dependence, unspecified, uncomplicated: Secondary | ICD-10-CM

## 2021-12-01 DIAGNOSIS — F1021 Alcohol dependence, in remission: Secondary | ICD-10-CM

## 2021-12-01 DIAGNOSIS — F3341 Major depressive disorder, recurrent, in partial remission: Secondary | ICD-10-CM | POA: Diagnosis not present

## 2021-12-01 NOTE — Progress Notes (Signed)
[facility]  Patient ID: John Estes,  MRN: [id number]  PCP: Daleen Snook, NP  Date of Evaluation: 12/01/2021 Time spent:30 minutes  Chief Complaint:  Chief Complaint   Anxiety; Depression      HISTORY/CURRENT STATUS: HPI For f/u med check.   Wants to do 2 things this year.  Cut out the carbonated beverages, and quit smoking.  He feels like he can do it, 1 thing at a time.  States he is surprised at how well he has done with weaning down the Xanax, and if he can do that, he feels that he can tackle the other things.  He is doing very well as far as the anxiety goes.  It still does occur and he needs the Xanax but is able to make a 1 week worth prescription last 10 days sometimes.  He is not drinking any alcohol and has not in almost a year.  Patient denies loss of interest in usual activities and is able to enjoy things.  Denies decreased energy or motivation.  Appetite has not changed.  No extreme sadness, tearfulness, or feelings of hopelessness.  Denies any changes in concentration, making decisions or remembering things.  Denies suicidal or homicidal thoughts.  Denies dizziness, syncope, seizures, numbness, tingling, tremor, tics, unsteady gait, slurred speech, confusion. Denies muscle or joint pain, stiffness, or dystonia.  Individual Medical History/ Review of Systems: Changes? :No    Past medications for mental health diagnoses include: Prozac, hydroxyzine, Gabapentin after ER visit for alcohol withdrawal.  Unclear how long he took that medication but he stopped it on his own, Trazodone. Xanax since around 46 yo.  BuSpar made him feel "funny." Propranolol didn't work.   Allergies: Penicillins  Current Medications:  Current Outpatient Medications:    ALPRAZolam (XANAX) 0.25 MG tablet, Take 1 tablet (0.25 mg total) by mouth 3 (three) times daily as needed for anxiety. Must last 7 days., Disp: 21 tablet, Rfl: 4   atorvastatin (LIPITOR) 20 MG tablet, Take 20  mg by mouth daily., Disp: , Rfl:    lisinopril (ZESTRIL) 20 MG tablet, Take 20 mg by mouth daily., Disp: , Rfl:    sertraline (ZOLOFT) 100 MG tablet, Take 2 tablets (200 mg total) by mouth daily., Disp: 60 tablet, Rfl: 5   traZODone (DESYREL) 50 MG tablet, TAKE 1 TO 2 TABLETS(50 TO 100 MG) BY MOUTH AT BEDTIME AS NEEDED FOR SLEEP, Disp: 60 tablet, Rfl: 0   omeprazole (PRILOSEC) 20 MG capsule, Take 1 capsule (20 mg total) by mouth daily. (Patient not taking: Reported on 08/19/2021), Disp: 30 capsule, Rfl: 0   ondansetron (ZOFRAN) 8 MG tablet, Take 1 tablet (8 mg total) by mouth every 8 (eight) hours as needed for nausea or vomiting. (Patient not taking: Reported on 04/29/2021), Disp: 20 tablet, Rfl: 0   propranolol (INDERAL) 20 MG tablet, Take 1 tablet (20 mg total) by mouth 3 (three) times daily as needed. (Patient not taking: Reported on 08/19/2021), Disp: 90 tablet, Rfl: 1 Medication Side Effects: none  Family Medical/ Social History: Changes? no  MENTAL HEALTH EXAM:  There were no vitals taken for this visit.There is no height or weight on file to calculate BMI.  General Appearance: Casual and Well Groomed  Eye Contact:  Good  Speech:  Clear and Coherent and Normal Rate  Volume:  Normal  Mood:  Euthymic  Affect:  Congruent  Thought Process:  Goal Directed and Descriptions of Associations: Circumstantial  Orientation:  Full (Time, Place, and Person)  Thought Content: Logical   Suicidal Thoughts:  No  Homicidal Thoughts:  No  Memory:  WNL  Judgement:  Good  Insight:  Good  Psychomotor Activity:  Normal  Concentration:  Concentration: Good and Attention Span: Good  Recall:  Good  Fund of Knowledge: Good  Language: Good  Assets:  Desire for Improvement  ADL's:  Intact  Cognition: WNL  Prognosis:  Good    DIAGNOSES:    ICD-10-CM   1. Social anxiety disorder  F40.10     2. Recurrent major depressive disorder, in partial remission ([place])  F33.41     3. Insomnia, unspecified type   G47.00     4. Smoker  F17.200     5. Alcoholism in recovery [facility])  F10.21        Receiving Psychotherapy: No    RECOMMENDATIONS:  PDMP was reviewed.  Last Xanax filled 11/17/2021. I provided 30 minutes of face to face time during this encounter, including time spent before and after the visit in records review, medical decision making, counseling pertinent to today's visit, and charting.  We discussed the goal of smoking cessation as well as stopping carbonated beverages.  I agree that we should do 1 thing at a time.  I recommend starting with the carbonated beverages, makes a caffeine free with a regular soft drink until he can gradually wean off the caffeine and then wean off the carbonated beverages completely.  I recommend that first because anxiety and depression can sometimes increase over the winter and I am afraid if we tried to have him stop smoking right now we would be setting him up for failure.  He agrees that this is the best approach.  At the next visit we will talk about how to stop smoking. Continue Xanax 0.25 mg, 1 tid prn.  Continue Zoloft 100 mg, 2 p.o. daily. Continue trazodone 50 mg, 1-2 nightly as needed sleep. Return in 2 months.  Donnal Moat, PA-C

## 2021-12-15 ENCOUNTER — Other Ambulatory Visit: Payer: Self-pay | Admitting: Physician Assistant

## 2021-12-15 NOTE — Telephone Encounter (Signed)
Patient lvm for refill on Xanax 0.25mg . Ph: [phone]. Appt 3/7. Pharmacy [facility] [address] [place], [place]

## 2021-12-15 NOTE — Telephone Encounter (Signed)
Filled 1/12 for 7 day supply

## 2022-01-17 ENCOUNTER — Other Ambulatory Visit: Payer: Self-pay | Admitting: Physician Assistant

## 2022-01-17 NOTE — Telephone Encounter (Signed)
Last filled 2/14 for #21

## 2022-01-31 ENCOUNTER — Ambulatory Visit: Payer: 59 | Admitting: Physician Assistant

## 2022-01-31 ENCOUNTER — Encounter: Payer: Self-pay | Admitting: Physician Assistant

## 2022-01-31 ENCOUNTER — Other Ambulatory Visit: Payer: Self-pay

## 2022-01-31 DIAGNOSIS — F1021 Alcohol dependence, in remission: Secondary | ICD-10-CM

## 2022-01-31 DIAGNOSIS — F4010 Social phobia, unspecified: Secondary | ICD-10-CM

## 2022-01-31 DIAGNOSIS — G4700 Insomnia, unspecified: Secondary | ICD-10-CM

## 2022-01-31 DIAGNOSIS — F17200 Nicotine dependence, unspecified, uncomplicated: Secondary | ICD-10-CM

## 2022-01-31 DIAGNOSIS — F3341 Major depressive disorder, recurrent, in partial remission: Secondary | ICD-10-CM

## 2022-01-31 NOTE — Progress Notes (Signed)
[facility] ? ?Patient ID: John Estes,  ?MRN: [id number] ? ?PCP: Josephina Gip, NP ? ?Date of Evaluation: 01/31/2022 ?Time spent:20 minutes ? ?Chief Complaint:  ?Chief Complaint   ?Anxiety; Insomnia; Depression; Follow-up ?  ? ? ?HISTORY/CURRENT STATUS: ?HPI For f/u med check.  ? ?Doing really well. Still needs the xanax every few days, but has done great with weaning down. Still gets it filled only every 1-2 weeks. Wants to have it with him in case of emergencies. Not drinking any alcohol, none in over a year now. Also not drinking nearly as many soft drinks.  Drinking a lot more water every day now, feels better physically with more energy.  The next thing he wants to do is quit smoking.  Wants to start that within the next couple of months. ? ?Not sleeping very well for the past month or so.  He has not been taking the trazodone.  In the past it did tend to make him drowsy the next day so he has been hesitant to take it.  Looking back though that was when he was taking a total of 2 mg of Xanax every day. ? ?Patient denies loss of interest in usual activities and is able to enjoy things.  Denies decreased energy or motivation.  Appetite has not changed.  No extreme sadness, tearfulness, or feelings of hopelessness.  Denies any changes in concentration, making decisions or remembering things.  Denies suicidal or homicidal thoughts. ? ?Denies dizziness, syncope, seizures, numbness, tingling, tremor, tics, unsteady gait, slurred speech, confusion. Denies muscle or joint pain, stiffness, or dystonia. ? ?Individual Medical History/ Review of Systems: Changes? :No   ? ?Past medications for mental health diagnoses include: ?Prozac, hydroxyzine, Gabapentin after ER visit for alcohol withdrawal.  Unclear how long he took that medication but he stopped it on his own, Trazodone. Xanax since around 46 yo.  BuSpar made him feel "funny." Propranolol didn't work.  ? ?Allergies: Penicillins ? ?Current Medications:   ?Current Outpatient Medications:  ?  ALPRAZolam (XANAX) 0.25 MG tablet, TAKE 1 TABLET(0.25 MG) BY MOUTH THREE TIMES DAILY AS NEEDED FOR ANXIETY, Disp: 21 tablet, Rfl: 3 ?  atorvastatin (LIPITOR) 20 MG tablet, Take 20 mg by mouth daily., Disp: , Rfl:  ?  lisinopril (ZESTRIL) 20 MG tablet, Take 20 mg by mouth daily., Disp: , Rfl:  ?  sertraline (ZOLOFT) 100 MG tablet, Take 2 tablets (200 mg total) by mouth daily., Disp: 60 tablet, Rfl: 5 ?  traZODone (DESYREL) 50 MG tablet, TAKE 1 TO 2 TABLETS(50 TO 100 MG) BY MOUTH AT BEDTIME AS NEEDED FOR SLEEP, Disp: 60 tablet, Rfl: 0 ?  omeprazole (PRILOSEC) 20 MG capsule, Take 1 capsule (20 mg total) by mouth daily. (Patient not taking: Reported on 08/19/2021), Disp: 30 capsule, Rfl: 0 ?  ondansetron (ZOFRAN) 8 MG tablet, Take 1 tablet (8 mg total) by mouth every 8 (eight) hours as needed for nausea or vomiting. (Patient not taking: Reported on 04/29/2021), Disp: 20 tablet, Rfl: 0 ?  propranolol (INDERAL) 20 MG tablet, Take 1 tablet (20 mg total) by mouth 3 (three) times daily as needed. (Patient not taking: Reported on 08/19/2021), Disp: 90 tablet, Rfl: 1 ?Medication Side Effects: none ? ?Family Medical/ Social History: Changes? no ? ?MENTAL HEALTH EXAM: ? ?There were no vitals taken for this visit.There is no height or weight on file to calculate BMI.  ?General Appearance: Casual and Well Groomed  ?Eye Contact:  Good  ?Speech:  Clear and Coherent and  Normal Rate  ?Volume:  Normal  ?Mood:  Euthymic  ?Affect:  Congruent  ?Thought Process:  Goal Directed and Descriptions of Associations: Circumstantial  ?Orientation:  Full (Time, Place, and Person)  ?Thought Content: Logical   ?Suicidal Thoughts:  No  ?Homicidal Thoughts:  No  ?Memory:  WNL  ?Judgement:  Good  ?Insight:  Good  ?Psychomotor Activity:  Normal  ?Concentration:  Concentration: Good and Attention Span: Good  ?Recall:  Good  ?Fund of Knowledge: Good  ?Language: Good  ?Assets:  Desire for Improvement ?Financial  Resources/Insurance ?Housing ?Transportation ?Vocational/Educational  ?ADL's:  Intact  ?Cognition: WNL  ?Prognosis:  Good  ? ? ?DIAGNOSES:  ?  ICD-10-CM   ?1. Social anxiety disorder  F40.10   ?  ?2. Recurrent major depressive disorder, in partial remission (HCC)  F33.41   ?  ?3. Insomnia, unspecified type  G47.00   ?  ?4. Smoker  F17.200   ?  ?5. Alcoholism in recovery [facility])  F10.21   ?  ? ? ?Receiving Psychotherapy: No  ? ? ?RECOMMENDATIONS:  ?PDMP was reviewed.  Last Xanax filled 01/18/2022. ?I provided 20 minutes of face to face time during this encounter, including time spent before and after the visit in records review, medical decision making, counseling pertinent to today's visit, and charting.  ?Congratulations on over a year of sobriety now! ?Sleep hygiene discussed.  Recommend he retry the trazodone. ?No other changes need to be made. ?At the next visit we will talk more in depth about smoking cessation.  In the meantime continue good healthy lifestyle choices with increased water, decreased soft drinks, daily exercise even if it is a 15-minute walk, eating lots of fruits and vegetables. ? ?Continue Xanax 0.25 mg, 1 tid prn.  ?Continue Zoloft 100 mg, 2 p.o. daily. ?Continue trazodone 50 mg, 1/2-2 nightly as needed sleep. ?Return in 2 months. ? ?Melony Overly, PA-C  ?

## 2022-02-20 ENCOUNTER — Other Ambulatory Visit: Payer: Self-pay | Admitting: Physician Assistant

## 2022-02-21 NOTE — Telephone Encounter (Signed)
Alprazolam RF ?

## 2022-03-13 ENCOUNTER — Other Ambulatory Visit: Payer: Self-pay | Admitting: Physician Assistant

## 2022-03-14 NOTE — Telephone Encounter (Signed)
Last filled trazodone 11/22 - verify if still taking ?

## 2022-03-21 ENCOUNTER — Other Ambulatory Visit: Payer: Self-pay | Admitting: Physician Assistant

## 2022-03-22 ENCOUNTER — Other Ambulatory Visit: Payer: Self-pay

## 2022-03-22 MED ORDER — ALPRAZOLAM 0.25 MG PO TABS: ORAL_TABLET | ORAL | 3 refills | Status: DC

## 2022-04-04 ENCOUNTER — Encounter: Payer: Self-pay | Admitting: Physician Assistant

## 2022-04-04 ENCOUNTER — Ambulatory Visit: Payer: 59 | Admitting: Physician Assistant

## 2022-04-04 DIAGNOSIS — F4010 Social phobia, unspecified: Secondary | ICD-10-CM

## 2022-04-04 DIAGNOSIS — F3341 Major depressive disorder, recurrent, in partial remission: Secondary | ICD-10-CM | POA: Diagnosis not present

## 2022-04-04 DIAGNOSIS — F17290 Nicotine dependence, other tobacco product, uncomplicated: Secondary | ICD-10-CM | POA: Diagnosis not present

## 2022-04-04 DIAGNOSIS — G4700 Insomnia, unspecified: Secondary | ICD-10-CM

## 2022-04-04 NOTE — Progress Notes (Signed)
[facility] ? ?Patient ID: John Estes,  ?MRN: [id number] ? ?PCP: Josephina Gip, NP ? ?Date of Evaluation: 04/04/2022 ?Time spent:30 minutes ? ?Chief Complaint:  ?Chief Complaint   ?Anxiety; Depression; Insomnia; Follow-up ?  ? ? ?HISTORY/CURRENT STATUS: ?HPI For f/u med check.  ? ?Ready to quit smoking! Has used nicorette gum and wellbutrin in the past which didn't help.  He did use a NicoDerm patch for 1 day 1 time when he was hospitalized.  He did not want to smoke but when he got out he started smoking again because he did not want to quit.  So not sure if it would help or not. ? ?He's almost quit drinking caffeine, but if he doesn't have a soft drink in the afternoon he gets a headache.  He has definitely cut back on a lot of sugar intake. ? ?He's still doing really well with the lower dose of Xanax. Still needs it every few days, but has come such a long way from being on 2 mg daily a little over a year ago to now sometimes 0/day.  Of course when needed it is still effective though. ? ?Patient denies loss of interest in usual activities and is able to enjoy things.  Denies decreased energy or motivation.  Appetite has not changed.  No extreme sadness, tearfulness, or feelings of hopelessness.  Work is going well.  He sleeps well most of the time, uses the trazodone sometimes.  Denies any changes in concentration, making decisions or remembering things.  Denies suicidal or homicidal thoughts. ? ?Patient denies increased energy with decreased need for sleep, no increased talkativeness, no racing thoughts, no impulsivity or risky behaviors, no increased spending, no increased libido, no grandiosity, no increased irritability or anger, no paranoia, and no hallucinations. ? ?Denies dizziness, syncope, seizures, numbness, tingling, tremor, tics, unsteady gait, slurred speech, confusion. Denies muscle or joint pain, stiffness, or dystonia. ? ?Individual Medical History/ Review of Systems: Changes? :No    ? ?Past medications for mental health diagnoses include: ?Prozac, hydroxyzine, Gabapentin after ER visit for alcohol withdrawal.  Unclear how long he took that medication but he stopped it on his own, Trazodone. Xanax since around 46 yo.  BuSpar made him feel "funny." Propranolol didn't work. Nicorette gum, Wellbutrin ? ?Allergies: Penicillins ? ?Current Medications:  ?Current Outpatient Medications:  ?  ALPRAZolam (XANAX) 0.25 MG tablet, TAKE 1 TABLET(0.25 MG) BY MOUTH THREE TIMES DAILY AS NEEDED FOR ANXIETY, Disp: 21 tablet, Rfl: 3 ?  atorvastatin (LIPITOR) 20 MG tablet, Take 20 mg by mouth daily., Disp: , Rfl:  ?  lisinopril (ZESTRIL) 20 MG tablet, Take 20 mg by mouth daily., Disp: , Rfl:  ?  sertraline (ZOLOFT) 100 MG tablet, TAKE 2 TABLETS(200 MG) BY MOUTH DAILY, Disp: 60 tablet, Rfl: 5 ?  traZODone (DESYREL) 50 MG tablet, TAKE 1 TO 2 TABLETS(50 TO 100 MG) BY MOUTH AT BEDTIME AS NEEDED FOR SLEEP, Disp: 60 tablet, Rfl: 0 ?  omeprazole (PRILOSEC) 20 MG capsule, Take 1 capsule (20 mg total) by mouth daily. (Patient not taking: Reported on 08/19/2021), Disp: 30 capsule, Rfl: 0 ?  ondansetron (ZOFRAN) 8 MG tablet, Take 1 tablet (8 mg total) by mouth every 8 (eight) hours as needed for nausea or vomiting. (Patient not taking: Reported on 04/29/2021), Disp: 20 tablet, Rfl: 0 ?  propranolol (INDERAL) 20 MG tablet, Take 1 tablet (20 mg total) by mouth 3 (three) times daily as needed. (Patient not taking: Reported on 08/19/2021), Disp: 90 tablet,  Rfl: 1 ?Medication Side Effects: none ? ?Family Medical/ Social History: Changes? no ? ?MENTAL HEALTH EXAM: ? ?There were no vitals taken for this visit.There is no height or weight on file to calculate BMI.  ?General Appearance: Casual and Well Groomed  ?Eye Contact:  Good  ?Speech:  Clear and Coherent and Normal Rate  ?Volume:  Normal  ?Mood:  Euthymic  ?Affect:  Congruent  ?Thought Process:  Goal Directed and Descriptions of Associations: Circumstantial  ?Orientation:  Full  (Time, Place, and Person)  ?Thought Content: Logical   ?Suicidal Thoughts:  No  ?Homicidal Thoughts:  No  ?Memory:  WNL  ?Judgement:  Good  ?Insight:  Good  ?Psychomotor Activity:  Normal  ?Concentration:  Concentration: Good and Attention Span: Good  ?Recall:  Good  ?Fund of Knowledge: Good  ?Language: Good  ?Assets:  Desire for Improvement  ?ADL's:  Intact  ?Cognition: WNL  ?Prognosis:  Good  ? ? ?DIAGNOSES:  ?  ICD-10-CM   ?1. Cigar smoker motivated to quit  F17.290   ?  ?2. Recurrent major depressive disorder, in partial remission (HCC)  F33.41   ?  ?3. Social anxiety disorder  F40.10   ?  ?4. Insomnia, unspecified type  G47.00   ?  ? ? ? ?Receiving Psychotherapy: No  ? ? ?RECOMMENDATIONS:  ?PDMP was reviewed.  Last Xanax filled 03/29/2022 . ?I provided 30 minutes of face to face time during this encounter, including time spent before and after the visit in records review, medical decision making, counseling pertinent to today's visit, and charting.  ?Discussed smoking cessation.  Options include Chantix.  Benefits, risks and side effects discussed and he does not want to take that now because of possible side effects.  NicoDerm is another option.  A third option will take much more self discipline but wean off the cigarettes by smoking less per day each week, for example he will go down to 20 cigarettes daily for at least 1 week, the next week he will go down to 19 cigarettes daily and on and on.  He prefers to try that.  Understands that he needs to count out the allotted number of cigarettes per day and not go over that.  And if he has any left over that day, do not add them to the next day's quantity.  He verbalizes understanding and wants to try this method first.  He can purchase the NicoDerm at any time if he feels like he is not going to be able to quit smoking with this method. ?Otherwise no changes. ? ?Continue Xanax 0.25 mg, 1 tid prn.  ?Continue Zoloft 100 mg, 2 p.o. daily. ?Continue trazodone 50 mg,  1/2-2 nightly as needed sleep. ?Return in 2 months. ? ?Melony Overly, PA-C  ?

## 2022-04-25 ENCOUNTER — Other Ambulatory Visit: Payer: Self-pay | Admitting: Physician Assistant

## 2022-04-25 NOTE — Telephone Encounter (Signed)
Last filled 5/18

## 2022-05-15 ENCOUNTER — Other Ambulatory Visit: Payer: Self-pay | Admitting: Physician Assistant

## 2022-05-23 ENCOUNTER — Other Ambulatory Visit: Payer: Self-pay | Admitting: Physician Assistant

## 2022-05-24 ENCOUNTER — Telehealth: Payer: Self-pay | Admitting: Physician Assistant

## 2022-05-24 ENCOUNTER — Other Ambulatory Visit: Payer: Self-pay

## 2022-05-24 MED ORDER — ALPRAZOLAM 0.25 MG PO TABS: ORAL_TABLET | ORAL | 0 refills | Status: DC

## 2022-05-24 NOTE — Telephone Encounter (Signed)
Patient lvm inquiring why his Xanax was not refilled. John Estes is scheduled for an appointment on 7/10. Please advise. # [phone].

## 2022-05-24 NOTE — Telephone Encounter (Signed)
This is sent on a weekly basis and teresa is out.I sent refill to regina to approve

## 2022-06-05 ENCOUNTER — Ambulatory Visit: Payer: 59 | Admitting: Physician Assistant

## 2022-06-05 ENCOUNTER — Encounter: Payer: Self-pay | Admitting: Physician Assistant

## 2022-06-05 DIAGNOSIS — F3341 Major depressive disorder, recurrent, in partial remission: Secondary | ICD-10-CM | POA: Diagnosis not present

## 2022-06-05 DIAGNOSIS — G4700 Insomnia, unspecified: Secondary | ICD-10-CM

## 2022-06-05 DIAGNOSIS — F17210 Nicotine dependence, cigarettes, uncomplicated: Secondary | ICD-10-CM

## 2022-06-05 DIAGNOSIS — F1021 Alcohol dependence, in remission: Secondary | ICD-10-CM

## 2022-06-05 DIAGNOSIS — F4010 Social phobia, unspecified: Secondary | ICD-10-CM

## 2022-06-05 MED ORDER — ALPRAZOLAM 0.25 MG PO TABS
0.2500 mg | ORAL_TABLET | Freq: Three times a day (TID) | ORAL | 1 refills | Status: DC | PRN
Start: 2022-06-05 — End: 2022-08-07

## 2022-06-05 NOTE — Progress Notes (Signed)
[facility]  Patient ID: John Estes,  MRN: [id number]  PCP: Josephina Gip, NP  Date of Evaluation: 06/05/2022 Time spent:30 minutes  Chief Complaint:  Chief Complaint   Depression; Anxiety; Insomnia; Follow-up    HISTORY/CURRENT STATUS: HPI For f/u med check.   Has cut smoking down by 1 cigarette daily each week since our last visit 2 months ago.  He is down to 19 cigarettes/day.  Very happy with that.  States he has plateaued at the Xanax dose.  He does not feel like he can decrease the dose or frequency any further.  For over a year now we have been weaning it and he has done a great job.  He has been getting a 1 week supply at a time, per his request to be more accountable, and there have been many times where a 7-day supply will last 10 or 11 days.  He is not drinking any alcohol at all and has not in over 1.5 years so this decrease in Xanax is a win.  I reminded him that he has decreased cigarettes in the past 2 months, he has cut back and almost cut out caffeine in the past year and has decreased carbs in the past year.  So for him to need the Xanax at this low dose is fine.  He does want to get completely off of it sometime.  Due to the history of anxiety we may need to increase the Zoloft or add something else to help prevent the anxiety if we are able to stop the Xanax completely.  He is not having panic attacks but more of a generalized sense of unease, like something bad is going to happen.  This time of year is busier at work which also affects his anxiety.  He has not been in the ER for a panic attack probably in 1.5-2 years now, and up until that time he went at least 2-3 times a year due to a panic attack.  Patient denies loss of interest in usual activities and is able to enjoy things.  Denies decreased energy or motivation.  Appetite has not changed.  No extreme sadness, tearfulness, or feelings of hopelessness.  He sleeps well most of the time, uses the  trazodone sometimes.  Denies any changes in concentration, making decisions or remembering things.  Denies suicidal or homicidal thoughts.  Patient denies increased energy with decreased need for sleep, no increased talkativeness, no racing thoughts, no impulsivity or risky behaviors, no increased spending, no increased libido, no grandiosity, no increased irritability or anger, no paranoia, and no hallucinations.  Denies dizziness, syncope, seizures, numbness, tingling, tremor, tics, unsteady gait, slurred speech, confusion. Denies muscle or joint pain, stiffness, or dystonia.  Individual Medical History/ Review of Systems: Changes? :No    Past medications for mental health diagnoses include: Prozac, hydroxyzine, Gabapentin after ER visit for alcohol withdrawal.  Unclear how long he took that medication but he stopped it on his own, Trazodone. Xanax since around 46 yo.  BuSpar made him feel "funny." Propranolol didn't work. Nicorette gum, Wellbutrin  Allergies: Penicillins  Current Medications:  Current Outpatient Medications:    atorvastatin (LIPITOR) 20 MG tablet, Take 20 mg by mouth daily., Disp: , Rfl:    lisinopril (ZESTRIL) 20 MG tablet, Take 20 mg by mouth daily., Disp: , Rfl:    sertraline (ZOLOFT) 100 MG tablet, TAKE 2 TABLETS(200 MG) BY MOUTH DAILY, Disp: 60 tablet, Rfl: 5   traZODone (DESYREL) 50 MG tablet, TAKE  1 TO 2 TABLETS(50 TO 100 MG) BY MOUTH AT BEDTIME AS NEEDED FOR SLEEP, Disp: 60 tablet, Rfl: 0   ALPRAZolam (XANAX) 0.25 MG tablet, Take 1 tablet (0.25 mg total) by mouth 3 (three) times daily as needed for anxiety., Disp: 90 tablet, Rfl: 1   omeprazole (PRILOSEC) 20 MG capsule, Take 1 capsule (20 mg total) by mouth daily. (Patient not taking: Reported on 08/19/2021), Disp: 30 capsule, Rfl: 0   ondansetron (ZOFRAN) 8 MG tablet, Take 1 tablet (8 mg total) by mouth every 8 (eight) hours as needed for nausea or vomiting. (Patient not taking: Reported on 04/29/2021), Disp: 20 tablet,  Rfl: 0   propranolol (INDERAL) 20 MG tablet, Take 1 tablet (20 mg total) by mouth 3 (three) times daily as needed. (Patient not taking: Reported on 08/19/2021), Disp: 90 tablet, Rfl: 1 Medication Side Effects: none  Family Medical/ Social History: Changes? no  MENTAL HEALTH EXAM:  There were no vitals taken for this visit.There is no height or weight on file to calculate BMI.  General Appearance: Casual and Well Groomed  Eye Contact:  Good  Speech:  Clear and Coherent and Normal Rate  Volume:  Normal  Mood:  Euthymic  Affect:  Congruent  Thought Process:  Goal Directed and Descriptions of Associations: Circumstantial  Orientation:  Full (Time, Place, and Person)  Thought Content: Logical   Suicidal Thoughts:  No  Homicidal Thoughts:  No  Memory:  WNL  Judgement:  Good  Insight:  Good  Psychomotor Activity:  Normal  Concentration:  Concentration: Good and Attention Span: Good  Recall:  Good  Fund of Knowledge: Good  Language: Good  Assets:  Desire for Improvement Financial Resources/Insurance Housing Transportation Vocational/Educational  ADL's:  Intact  Cognition: WNL  Prognosis:  Good    DIAGNOSES:    ICD-10-CM   1. Social anxiety disorder  F40.10     2. Recurrent major depressive disorder, in partial remission (HCC)  F33.41     3. Cigarette smoker motivated to quit  F17.210     4. Insomnia, unspecified type  G47.00     5. Alcoholism in recovery [facility])  F10.21       Receiving Psychotherapy: No    RECOMMENDATIONS:  PDMP was reviewed.  Last Xanax filled 06/01/2022.  I provided 30 minutes of face to face time during this encounter, including time spent before and after the visit in records review, medical decision making, counseling pertinent to today's visit, and charting.   He's doing great with very slow decrease in number of cigarettes smoked per day.  He can go down faster if he would like but I think it is more realistic to decrease each week and he  agrees.  Discussed the Xanax.  I reminded him that he was on a much higher dose and he has been able to wean down to this, so it's a win!  By his choice I have been sending in a 1 week supply at a time but he feels that he will not be tempted to take more than is prescribed and would like to have a 1 month supply now.  I am completely fine with this.  Continue Xanax 0.25 mg, 1 tid prn.  Continue Zoloft 100 mg, 2 p.o. daily. Continue trazodone 50 mg, 1/2-2 nightly as needed sleep. Return in 2 months.  Melony Overly, PA-C

## 2022-06-14 ENCOUNTER — Other Ambulatory Visit: Payer: Self-pay | Admitting: Physician Assistant

## 2022-08-07 ENCOUNTER — Ambulatory Visit: Payer: 59 | Admitting: Physician Assistant

## 2022-08-07 ENCOUNTER — Encounter: Payer: Self-pay | Admitting: Physician Assistant

## 2022-08-07 DIAGNOSIS — F4010 Social phobia, unspecified: Secondary | ICD-10-CM | POA: Diagnosis not present

## 2022-08-07 DIAGNOSIS — F1021 Alcohol dependence, in remission: Secondary | ICD-10-CM | POA: Diagnosis not present

## 2022-08-07 DIAGNOSIS — F3341 Major depressive disorder, recurrent, in partial remission: Secondary | ICD-10-CM | POA: Diagnosis not present

## 2022-08-07 DIAGNOSIS — F17200 Nicotine dependence, unspecified, uncomplicated: Secondary | ICD-10-CM | POA: Diagnosis not present

## 2022-08-07 MED ORDER — ALPRAZOLAM 0.25 MG PO TABS: 0.2500 mg | ORAL_TABLET | Freq: Three times a day (TID) | ORAL | 1 refills | Status: DC | PRN

## 2022-08-07 MED ORDER — SERTRALINE HCL 100 MG PO TABS
ORAL_TABLET | ORAL | 11 refills | Status: AC
Start: 2022-08-07 — End: ?

## 2022-08-07 NOTE — Progress Notes (Signed)
[facility]  Patient ID: John Estes,  MRN: [id number]  PCP: Josephina Gip, NP  Date of Evaluation: 08/07/2022 Time spent:20 minutes  Chief Complaint:  Chief Complaint   Anxiety; Depression; Insomnia; Follow-up    HISTORY/CURRENT STATUS: HPI For f/u med check.   Patient is able to enjoy things.  Energy and motivation are good.  Work is going well.   No extreme sadness, tearfulness, or feelings of hopelessness.  Sleeps well most of the time.  Takes trazodone sometimes but not every night.  ADLs and personal hygiene are normal.   Denies any changes in concentration, making decisions, or remembering things.  Appetite has not changed.  Weight is stable.   Denies suicidal or homicidal thoughts.  At [facility] we started giving him 90 pills of the Xanax, after only doing a 1 week supply at a time for over a year.  He has done very well, denies overtaking at all and the prescription even sometimes lasts longer than a month.  He is not drinking any alcohol and has not in almost 2 years.  He is still trying to quit smoking but has plateaued at around 16 or 17 cigarettes/day.  He is under a lot of stress, worried about his sister who has divorced and she is drinking pretty heavily.  He is trying to help her stop.  Patient denies increased energy with decreased need for sleep, increased talkativeness, racing thoughts, impulsivity or risky behaviors, increased spending, increased libido, grandiosity, increased irritability or anger, paranoia, or hallucinations.  Denies dizziness, syncope, seizures, numbness, tingling, tremor, tics, unsteady gait, slurred speech, confusion. Denies muscle or joint pain, stiffness, or dystonia.  Individual Medical History/ Review of Systems: Changes? :No    Past medications for mental health diagnoses include: Prozac, hydroxyzine, Gabapentin after ER visit for alcohol withdrawal.  Unclear how long he took that medication but he stopped it on his own,  Trazodone. Xanax since around 46 yo.  BuSpar made him feel "funny." Propranolol didn't work. Nicorette gum, Wellbutrin  Allergies: Penicillins  Current Medications:  Current Outpatient Medications:    atorvastatin (LIPITOR) 20 MG tablet, Take 20 mg by mouth daily., Disp: , Rfl:    lisinopril (ZESTRIL) 20 MG tablet, Take 20 mg by mouth daily., Disp: , Rfl:    traZODone (DESYREL) 50 MG tablet, TAKE 1 TO 2 TABLETS(50 TO 100 MG) BY MOUTH AT BEDTIME AS NEEDED FOR SLEEP, Disp: 60 tablet, Rfl: 1   ALPRAZolam (XANAX) 0.25 MG tablet, Take 1 tablet (0.25 mg total) by mouth 3 (three) times daily as needed for anxiety., Disp: 90 tablet, Rfl: 1   omeprazole (PRILOSEC) 20 MG capsule, Take 1 capsule (20 mg total) by mouth daily. (Patient not taking: Reported on 08/19/2021), Disp: 30 capsule, Rfl: 0   ondansetron (ZOFRAN) 8 MG tablet, Take 1 tablet (8 mg total) by mouth every 8 (eight) hours as needed for nausea or vomiting. (Patient not taking: Reported on 04/29/2021), Disp: 20 tablet, Rfl: 0   propranolol (INDERAL) 20 MG tablet, Take 1 tablet (20 mg total) by mouth 3 (three) times daily as needed. (Patient not taking: Reported on 08/19/2021), Disp: 90 tablet, Rfl: 1   sertraline (ZOLOFT) 100 MG tablet, TAKE 2 TABLETS(200 MG) BY MOUTH DAILY, Disp: 60 tablet, Rfl: 11 Medication Side Effects: none  Family Medical/ Social History: Changes? No  MENTAL HEALTH EXAM:  There were no vitals taken for this visit.There is no height or weight on file to calculate BMI.  General Appearance: Casual and Well  Groomed  Eye Contact:  Good  Speech:  Clear and Coherent and Normal Rate  Volume:  Normal  Mood:  Euthymic  Affect:  Congruent  Thought Process:  Goal Directed and Descriptions of Associations: Circumstantial  Orientation:  Full (Time, Place, and Person)  Thought Content: Logical   Suicidal Thoughts:  No  Homicidal Thoughts:  No  Memory:  WNL  Judgement:  Good  Insight:  Good  Psychomotor Activity:  Normal   Concentration:  Concentration: Good and Attention Span: Good  Recall:  Good  Fund of Knowledge: Good  Language: Good  Assets:  Desire for Improvement  ADL's:  Intact  Cognition: WNL  Prognosis:  Good   DIAGNOSES:    ICD-10-CM   1. Recurrent major depressive disorder, in partial remission (HCC)  F33.41     2. Social anxiety disorder  F40.10     3. Alcoholism in recovery (HCC)  F10.21     4. Smoker  F17.200      Receiving Psychotherapy: No   RECOMMENDATIONS:  PDMP was reviewed.  Last Xanax filled 07/13/2022. I provided 20 minutes of face to face time during this encounter, including time spent before and after the visit in records review, medical decision making, counseling pertinent to today's visit, and charting.   Maggie is doing great and I have encouraged him to continue to wean off cigarettes.  He is responding well to his medications so no changes will be made.  Continue Xanax 0.25 mg, 1 tid prn.  Continue Zoloft 100 mg, 2 p.o. daily. Continue trazodone 50 mg, 1/2-2 nightly as needed sleep. Return in 2 months.  Melony Overly, PA-C

## 2022-09-09 ENCOUNTER — Other Ambulatory Visit: Payer: Self-pay | Admitting: Physician Assistant

## 2022-10-23 ENCOUNTER — Encounter: Payer: Self-pay | Admitting: Physician Assistant

## 2022-10-23 ENCOUNTER — Ambulatory Visit: Payer: 59 | Admitting: Physician Assistant

## 2022-10-23 DIAGNOSIS — F3342 Major depressive disorder, recurrent, in full remission: Secondary | ICD-10-CM

## 2022-10-23 DIAGNOSIS — F4010 Social phobia, unspecified: Secondary | ICD-10-CM

## 2022-10-23 DIAGNOSIS — F1021 Alcohol dependence, in remission: Secondary | ICD-10-CM

## 2022-10-23 DIAGNOSIS — F17200 Nicotine dependence, unspecified, uncomplicated: Secondary | ICD-10-CM | POA: Diagnosis not present

## 2022-10-23 MED ORDER — ALPRAZOLAM 0.25 MG PO TABS: 0.2500 mg | ORAL_TABLET | Freq: Three times a day (TID) | ORAL | 1 refills | Status: DC | PRN

## 2022-10-23 NOTE — Progress Notes (Signed)
[facility]  Patient ID: John Estes,  MRN: [id number]  PCP: Daleen Snook, NP  Date of Evaluation: 10/23/2022 Time spent:20 minutes  Chief Complaint:  Chief Complaint   Anxiety; Depression; Follow-up    HISTORY/CURRENT STATUS: HPI For f/u med check.   He's doing well. Having family stressors though. His sister's friend died from an aneurysm earlier this month.  Energy and motivation are good.  Work is going well.   No extreme sadness, tearfulness, or feelings of hopelessness.  Sleeps well most of the time. ADLs and personal hygiene are normal.   Denies any changes in concentration, making decisions, or remembering things.  Appetite has not changed.  Weight is stable.   Denies suicidal or homicidal thoughts.  He still has anxiety, gets panicky at times, does continue to need the Xanax but he is taking very sparingly.  At this time a few years ago he was taking 2 mg a day and now he is taking 0.5 mg a day max.  Patient denies increased energy with decreased need for sleep, increased talkativeness, racing thoughts, impulsivity or risky behaviors, increased spending, increased libido, grandiosity, increased irritability or anger, paranoia, or hallucinations.  Denies dizziness, syncope, seizures, numbness, tingling, tremor, tics, unsteady gait, slurred speech, confusion. Denies muscle or joint pain, stiffness, or dystonia.  Individual Medical History/ Review of Systems: Changes? :No    Past medications for mental health diagnoses include: Prozac, hydroxyzine, Gabapentin after ER visit for alcohol withdrawal.  Unclear how long he took that medication but he stopped it on his own, Trazodone. Xanax since around 46 yo.  BuSpar made him feel "funny." Propranolol didn't work. Nicorette gum, Wellbutrin  Allergies: Penicillins  Current Medications:  Current Outpatient Medications:    atorvastatin (LIPITOR) 20 MG tablet, Take 20 mg by mouth daily., Disp: , Rfl:    lisinopril  (ZESTRIL) 20 MG tablet, Take 20 mg by mouth daily., Disp: , Rfl:    sertraline (ZOLOFT) 100 MG tablet, TAKE 2 TABLETS(200 MG) BY MOUTH DAILY, Disp: 60 tablet, Rfl: 11   traZODone (DESYREL) 50 MG tablet, TAKE 1 TO 2 TABLETS(50 TO 100 MG) BY MOUTH AT BEDTIME AS NEEDED FOR SLEEP, Disp: 60 tablet, Rfl: 1   ALPRAZolam (XANAX) 0.25 MG tablet, Take 1 tablet (0.25 mg total) by mouth 3 (three) times daily as needed for anxiety., Disp: 90 tablet, Rfl: 1   omeprazole (PRILOSEC) 20 MG capsule, Take 1 capsule (20 mg total) by mouth daily. (Patient not taking: Reported on 08/19/2021), Disp: 30 capsule, Rfl: 0   ondansetron (ZOFRAN) 8 MG tablet, Take 1 tablet (8 mg total) by mouth every 8 (eight) hours as needed for nausea or vomiting. (Patient not taking: Reported on 04/29/2021), Disp: 20 tablet, Rfl: 0   propranolol (INDERAL) 20 MG tablet, Take 1 tablet (20 mg total) by mouth 3 (three) times daily as needed. (Patient not taking: Reported on 08/19/2021), Disp: 90 tablet, Rfl: 1 Medication Side Effects: none  Family Medical/ Social History: Changes? No  MENTAL HEALTH EXAM:  There were no vitals taken for this visit.There is no height or weight on file to calculate BMI.  General Appearance: Casual and Well Groomed  Eye Contact:  Good  Speech:  Clear and Coherent and Normal Rate  Volume:  Normal  Mood:  Euthymic  Affect:  Congruent  Thought Process:  Goal Directed and Descriptions of Associations: Circumstantial  Orientation:  Full (Time, Place, and Person)  Thought Content: Logical   Suicidal Thoughts:  No  Homicidal Thoughts:  No  Memory:  WNL  Judgement:  Good  Insight:  Good  Psychomotor Activity:  Normal  Concentration:  Concentration: Good and Attention Span: Good  Recall:  Good  Fund of Knowledge: Good  Language: Good  Assets:  Desire for Improvement Financial Resources/Insurance Housing Transportation Vocational/Educational  ADL's:  Intact  Cognition: WNL  Prognosis:  Good   DIAGNOSES:     ICD-10-CM   1. Major depression, recurrent, full remission (HCC)  F33.42     2. Social anxiety disorder  F40.10     3. Alcoholism in recovery (HCC)  F10.21     4. Smoker  F17.200       Receiving Psychotherapy: No   RECOMMENDATIONS:  PDMP was reviewed.  Last Xanax filled 09/22/2022. I provided 20 minutes of face to face time during this encounter, including time spent before and after the visit in records review, medical decision making, counseling pertinent to today's visit, and charting.   He is responding well to his medications so no changes will be made. Smoking cessation discussed.   Continue Xanax 0.25 mg, 1/2-1 tid prn.  Continue Zoloft 100 mg, 2 p.o. daily. Continue trazodone 50 mg, 1/2-2 nightly as needed sleep. Return in 3 months.  Melony Overly, PA-C

## 2022-12-26 ENCOUNTER — Other Ambulatory Visit: Payer: Self-pay | Admitting: Physician Assistant

## 2022-12-27 NOTE — Telephone Encounter (Signed)
Filled 12/29

## 2023-01-23 ENCOUNTER — Ambulatory Visit: Payer: 59 | Admitting: Physician Assistant

## 2023-01-25 ENCOUNTER — Other Ambulatory Visit: Payer: Self-pay | Admitting: Physician Assistant

## 2023-02-19 ENCOUNTER — Ambulatory Visit (INDEPENDENT_AMBULATORY_CARE_PROVIDER_SITE_OTHER): Payer: 59 | Admitting: Physician Assistant

## 2023-02-19 ENCOUNTER — Encounter: Payer: Self-pay | Admitting: Physician Assistant

## 2023-02-19 DIAGNOSIS — F3342 Major depressive disorder, recurrent, in full remission: Secondary | ICD-10-CM | POA: Diagnosis not present

## 2023-02-19 DIAGNOSIS — F4010 Social phobia, unspecified: Secondary | ICD-10-CM

## 2023-02-19 DIAGNOSIS — F17200 Nicotine dependence, unspecified, uncomplicated: Secondary | ICD-10-CM | POA: Diagnosis not present

## 2023-02-19 DIAGNOSIS — G4700 Insomnia, unspecified: Secondary | ICD-10-CM | POA: Diagnosis not present

## 2023-02-19 MED ORDER — TRAZODONE HCL 50 MG PO TABS: ORAL_TABLET | ORAL | 11 refills | Status: DC

## 2023-02-19 MED ORDER — ALPRAZOLAM 0.25 MG PO TABS: ORAL_TABLET | ORAL | 2 refills | Status: DC

## 2023-02-19 NOTE — Progress Notes (Signed)
[facility]  Patient ID: John Estes,  MRN: [id number]  PCP: Daleen Snook, NP  Date of Evaluation: 02/19/2023 Time spent:20 minutes  Chief Complaint:  Chief Complaint   Anxiety; Depression; Follow-up    HISTORY/CURRENT STATUS: HPI For f/u med check.   Doing really well. Has been promoted which has been a good thing but more stressful. Still takes Xanax as directed. Not having PA, knows he'll be extra anxious when he has to travel to [place] soon.  Patient is able to enjoy things.  Energy and motivation are good.  No extreme sadness, tearfulness, or feelings of hopelessness.  Sleeps well most of the time but has had to take the [place] more often lately.  ADLs and personal hygiene are normal.   Denies any changes in concentration, making decisions, or remembering things.  Appetite has not changed.  Weight is stable.  Denies suicidal or homicidal thoughts.  Patient denies increased energy with decreased need for sleep, increased talkativeness, racing thoughts, impulsivity or risky behaviors, increased spending, increased libido, grandiosity, increased irritability or anger, paranoia, or hallucinations.  Denies dizziness, syncope, seizures, numbness, tingling, tremor, tics, unsteady gait, slurred speech, confusion. Denies muscle or joint pain, stiffness, or dystonia.  Individual Medical History/ Review of Systems: Changes? :No    Past medications for mental health diagnoses include: Prozac, hydroxyzine, Gabapentin after ER visit for alcohol withdrawal.  Unclear how long he took that medication but he stopped it on his own, Trazodone. Xanax since around 47 yo.  BuSpar made him feel "funny." Propranolol didn't work. Nicorette gum, Wellbutrin  Allergies: Penicillins  Current Medications:  Current Outpatient Medications:    atorvastatin (LIPITOR) 20 MG tablet, Take 20 mg by mouth daily., Disp: , Rfl:    lisinopril (ZESTRIL) 20 MG tablet, Take 20 mg by mouth daily., Disp: ,  Rfl:    sertraline (ZOLOFT) 100 MG tablet, TAKE 2 TABLETS(200 MG) BY MOUTH DAILY, Disp: 60 tablet, Rfl: 11   ALPRAZolam (XANAX) 0.25 MG tablet, TAKE 1 TABLET(0.25 MG) BY MOUTH THREE TIMES DAILY AS NEEDED FOR ANXIETY, Disp: 90 tablet, Rfl: 2   omeprazole (PRILOSEC) 20 MG capsule, Take 1 capsule (20 mg total) by mouth daily. (Patient not taking: Reported on 08/19/2021), Disp: 30 capsule, Rfl: 0   ondansetron (ZOFRAN) 8 MG tablet, Take 1 tablet (8 mg total) by mouth every 8 (eight) hours as needed for nausea or vomiting. (Patient not taking: Reported on 04/29/2021), Disp: 20 tablet, Rfl: 0   propranolol (INDERAL) 20 MG tablet, Take 1 tablet (20 mg total) by mouth 3 (three) times daily as needed. (Patient not taking: Reported on 08/19/2021), Disp: 90 tablet, Rfl: 1   traZODone (DESYREL) 50 MG tablet, TAKE 1 TO 2 TABLETS(50 TO 100 MG) BY MOUTH AT BEDTIME AS NEEDED FOR SLEEP, Disp: 60 tablet, Rfl: 11 Medication Side Effects: none  Family Medical/ Social History: Changes? No  MENTAL HEALTH EXAM:  There were no vitals taken for this visit.There is no height or weight on file to calculate BMI.  General Appearance: Casual and Well Groomed  Eye Contact:  Good  Speech:  Clear and Coherent and Normal Rate  Volume:  Normal  Mood:  Euthymic  Affect:  Congruent  Thought Process:  Goal Directed and Descriptions of Associations: Circumstantial  Orientation:  Full (Time, Place, and Person)  Thought Content: Logical   Suicidal Thoughts:  No  Homicidal Thoughts:  No  Memory:  WNL  Judgement:  Good  Insight:  Good  Psychomotor Activity:  Normal  Concentration:  Concentration: Good and Attention Span: Good  Recall:  Good  Fund of Knowledge: Good  Language: Good  Assets:  Communication Skills Desire for Improvement Financial Resources/Insurance Housing Transportation Vocational/Educational  ADL's:  Intact  Cognition: WNL  Prognosis:  Good   DIAGNOSES:    ICD-10-CM   1. Major depression, recurrent,  full remission ([place])  F33.42     2. Social anxiety disorder  F40.10     3. Insomnia, unspecified type  G47.00     4. Smoker  F17.200      Receiving Psychotherapy: No   RECOMMENDATIONS:  PDMP was reviewed.  Last Xanax filled 01/30/2023.  I provided 20 minutes of face to face time during this encounter, including time spent before and after the visit in records review, medical decision making, counseling pertinent to today's visit, and charting.   Doing really well. No changes will be made. Smoking cessation discussed, he's continuing to gradually decrease the amt of cigs he smokes per day.   Continue Xanax 0.25 mg, 1/2-1 tid prn.  Continue Zoloft 100 mg, 2 p.o. daily. Continue trazodone 50 mg, 1/2-2 nightly as needed sleep. Return in 3 months.  Donnal Moat, PA-C

## 2023-05-22 ENCOUNTER — Ambulatory Visit: Payer: 59 | Admitting: Physician Assistant

## 2023-05-22 ENCOUNTER — Encounter: Payer: Self-pay | Admitting: Physician Assistant

## 2023-05-22 DIAGNOSIS — F3342 Major depressive disorder, recurrent, in full remission: Secondary | ICD-10-CM | POA: Diagnosis not present

## 2023-05-22 DIAGNOSIS — G4700 Insomnia, unspecified: Secondary | ICD-10-CM

## 2023-05-22 DIAGNOSIS — F4010 Social phobia, unspecified: Secondary | ICD-10-CM

## 2023-05-22 DIAGNOSIS — F17200 Nicotine dependence, unspecified, uncomplicated: Secondary | ICD-10-CM

## 2023-05-22 DIAGNOSIS — F1021 Alcohol dependence, in remission: Secondary | ICD-10-CM

## 2023-05-22 MED ORDER — ALPRAZOLAM 0.25 MG PO TABS: ORAL_TABLET | ORAL | 3 refills | Status: DC

## 2023-05-22 NOTE — Progress Notes (Signed)
[facility]  Patient ID: John Estes,  MRN: [id number]  PCP: Josephina Gip, NP  Date of Evaluation: 05/22/2023 Time spent:20 minutes  Chief Complaint:  Chief Complaint   Anxiety; Depression; Insomnia; Follow-up    HISTORY/CURRENT STATUS: HPI For f/u med check.   Doing well overall. He's cutting back on caffeine, then next he'll work on sugar. He's still smoking about 1/2 ppd. Has stalled out. Doesn't want any help w/ smoking cessation right now but he will let me know if he does.  No alcohol since early 2022.  Patient is able to enjoy things.  Energy and motivation are good.  Work is going well, busy.   No extreme sadness, tearfulness, or feelings of hopelessness.  Sleeps well with Trazodone.  ADLs and personal hygiene are normal.   Denies any changes in concentration, making decisions, or remembering things.  Appetite has not changed.  Weight is stable.   Anxiety is well controlled.  Does not usually have panic attacks but will still get overwhelmed, feels a sense of dread, like something bad may happen at any time.  The Xanax is helpful.  Denies suicidal or homicidal thoughts.  Patient denies increased energy with decreased need for sleep, increased talkativeness, racing thoughts, impulsivity or risky behaviors, increased spending, increased libido, grandiosity, increased irritability or anger, paranoia, or hallucinations.  Denies dizziness, syncope, seizures, numbness, tingling, tremor, tics, unsteady gait, slurred speech, confusion. Denies muscle or joint pain, stiffness, or dystonia.  Individual Medical History/ Review of Systems: Changes? :No    Past medications for mental health diagnoses include: Prozac, hydroxyzine, Gabapentin after ER visit for alcohol withdrawal.  Unclear how long he took that medication but he stopped it on his own, Trazodone. Xanax since around 47 yo.  BuSpar made him feel "funny." Propranolol didn't work. Nicorette gum,  Wellbutrin  Allergies: Penicillins  Current Medications:  Current Outpatient Medications:    atorvastatin (LIPITOR) 20 MG tablet, Take 20 mg by mouth daily., Disp: , Rfl:    lisinopril (ZESTRIL) 20 MG tablet, Take 20 mg by mouth daily., Disp: , Rfl:    sertraline (ZOLOFT) 100 MG tablet, TAKE 2 TABLETS(200 MG) BY MOUTH DAILY, Disp: 60 tablet, Rfl: 11   traZODone (DESYREL) 50 MG tablet, TAKE 1 TO 2 TABLETS(50 TO 100 MG) BY MOUTH AT BEDTIME AS NEEDED FOR SLEEP, Disp: 60 tablet, Rfl: 11   ALPRAZolam (XANAX) 0.25 MG tablet, TAKE 1 TABLET(0.25 MG) BY MOUTH THREE TIMES DAILY AS NEEDED FOR ANXIETY, Disp: 90 tablet, Rfl: 3 Medication Side Effects: none  Family Medical/ Social History: Changes? No  MENTAL HEALTH EXAM:  There were no vitals taken for this visit.There is no height or weight on file to calculate BMI.  General Appearance: Casual, Well Groomed, and partially edentulous   Eye Contact:  Good  Speech:  Clear and Coherent and Normal Rate  Volume:  Normal  Mood:  Euthymic  Affect:  Congruent  Thought Process:  Goal Directed and Descriptions of Associations: Circumstantial  Orientation:  Full (Time, Place, and Person)  Thought Content: Logical   Suicidal Thoughts:  No  Homicidal Thoughts:  No  Memory:  WNL  Judgement:  Good  Insight:  Good  Psychomotor Activity:  Normal  Concentration:  Concentration: Good and Attention Span: Good  Recall:  Good  Fund of Knowledge: Good  Language: Good  Assets:  Communication Skills Desire for Improvement Financial Resources/Insurance Housing Resilience Transportation Vocational/Educational  ADL's:  Intact  Cognition: WNL  Prognosis:  Good   DIAGNOSES:  ICD-10-CM   1. Major depression, recurrent, full remission (HCC)  F33.42     2. Social anxiety disorder  F40.10     3. Insomnia, unspecified type  G47.00     4. Smoker  F17.200     5. Alcoholism in recovery [facility])  F10.21       Receiving Psychotherapy: No   RECOMMENDATIONS:   PDMP was reviewed.  Last Xanax filled 05/07/2023. I provided 20 minutes of face to face time during this encounter, including time spent before and after the visit in records review, medical decision making, counseling pertinent to today's visit, and charting.   Advith is doing well so no changes will be made. Smoking cessation discussed. Sleep hygiene discussed.  Continue Xanax 0.25 mg, 1/2-1 tid prn.  Continue Zoloft 100 mg, 2 p.o. daily. Continue trazodone 50 mg, 1/2-2 nightly as needed sleep. Return in 4 months.  Melony Overly, PA-C

## 2023-08-23 ENCOUNTER — Other Ambulatory Visit: Payer: Self-pay | Admitting: Physician Assistant

## 2023-09-24 ENCOUNTER — Ambulatory Visit (INDEPENDENT_AMBULATORY_CARE_PROVIDER_SITE_OTHER): Payer: 59 | Admitting: Physician Assistant

## 2023-09-24 ENCOUNTER — Encounter: Payer: Self-pay | Admitting: Physician Assistant

## 2023-09-24 DIAGNOSIS — F17200 Nicotine dependence, unspecified, uncomplicated: Secondary | ICD-10-CM | POA: Diagnosis not present

## 2023-09-24 DIAGNOSIS — F3342 Major depressive disorder, recurrent, in full remission: Secondary | ICD-10-CM | POA: Diagnosis not present

## 2023-09-24 DIAGNOSIS — F4010 Social phobia, unspecified: Secondary | ICD-10-CM

## 2023-09-24 DIAGNOSIS — G4700 Insomnia, unspecified: Secondary | ICD-10-CM | POA: Diagnosis not present

## 2023-09-24 MED ORDER — SERTRALINE HCL 100 MG PO TABS: 200.0000 mg | ORAL_TABLET | Freq: Every day | ORAL | 1 refills | Status: DC

## 2023-09-24 MED ORDER — ALPRAZOLAM 0.25 MG PO TABS: 0.2500 mg | ORAL_TABLET | Freq: Three times a day (TID) | ORAL | 3 refills | Status: DC | PRN

## 2023-09-24 NOTE — Progress Notes (Signed)
[facility]  Patient ID: John Estes,  MRN: [id number]  PCP: Josephina Gip, NP  Date of Evaluation: 09/24/2023 Time spent:20 minutes  Chief Complaint:  Chief Complaint   Anxiety; Depression; Follow-up    HISTORY/CURRENT STATUS: HPI For f/u med check.   Doing well.  Patient is able to enjoy things.  Energy and motivation are good.  Work is going well.   No extreme sadness, tearfulness, or feelings of hopelessness.  Sleeps well most of the time.  ADLs and personal hygiene are normal.   Denies any changes in concentration, making decisions, or remembering things.  Appetite has not changed.  Weight is stable.  Anxiety is well controlled. Xanax helps.  Denies suicidal or homicidal thoughts.  Patient denies increased energy with decreased need for sleep, increased talkativeness, racing thoughts, impulsivity or risky behaviors, increased spending, increased libido, grandiosity, increased irritability or anger, paranoia, or hallucinations.  Denies dizziness, syncope, seizures, numbness, tingling, tremor, tics, unsteady gait, slurred speech, confusion. Denies muscle or joint pain, stiffness, or dystonia. Denies unexplained weight loss, frequent infections, or sores that heal slowly.  No polyphagia, polydipsia, or polyuria. Denies visual changes or paresthesias.   Individual Medical History/ Review of Systems: Changes? :No    Past medications for mental health diagnoses include: Prozac, hydroxyzine, Gabapentin after ER visit for alcohol withdrawal.  Unclear how long he took that medication but he stopped it on his own, Trazodone. Xanax since around 47 yo.  BuSpar made him feel "funny." Propranolol didn't work. Nicorette gum, Wellbutrin  Allergies: Penicillins  Current Medications:  Current Outpatient Medications:    atorvastatin (LIPITOR) 20 MG tablet, Take 20 mg by mouth daily., Disp: , Rfl:    famotidine (PEPCID) 20 MG tablet, Take 20 mg by mouth daily., Disp: , Rfl:     lisinopril (ZESTRIL) 20 MG tablet, Take 20 mg by mouth daily., Disp: , Rfl:    pantoprazole (PROTONIX) 40 MG tablet, Take 40 mg by mouth daily., Disp: , Rfl:    traZODone (DESYREL) 50 MG tablet, TAKE 1 TO 2 TABLETS(50 TO 100 MG) BY MOUTH AT BEDTIME AS NEEDED FOR SLEEP, Disp: 60 tablet, Rfl: 11   ALPRAZolam (XANAX) 0.25 MG tablet, Take 1 tablet (0.25 mg total) by mouth 3 (three) times daily as needed for anxiety., Disp: 90 tablet, Rfl: 3   sertraline (ZOLOFT) 100 MG tablet, Take 2 tablets (200 mg total) by mouth daily., Disp: 180 tablet, Rfl: 1 Medication Side Effects: none  Family Medical/ Social History: Changes? No  MENTAL HEALTH EXAM:  There were no vitals taken for this visit.There is no height or weight on file to calculate BMI.  General Appearance: Casual, Well Groomed, and partially edentulous   Eye Contact:  Good  Speech:  Clear and Coherent and Normal Rate  Volume:  Normal  Mood:  Euthymic  Affect:  Congruent  Thought Process:  Goal Directed and Descriptions of Associations: Circumstantial  Orientation:  Full (Time, Place, and Person)  Thought Content: Logical   Suicidal Thoughts:  No  Homicidal Thoughts:  No  Memory:  WNL  Judgement:  Good  Insight:  Good  Psychomotor Activity:  Normal  Concentration:  Concentration: Good and Attention Span: Good  Recall:  Good  Fund of Knowledge: Good  Language: Good  Assets:  Communication Skills Desire for Improvement Financial Resources/Insurance Housing Physical Health Resilience Transportation Vocational/Educational  ADL's:  Intact  Cognition: WNL  Prognosis:  Good   DIAGNOSES:    ICD-10-CM   1. Social anxiety disorder  F40.10     2. Major depression, recurrent, full remission (HCC)  F33.42     3. Insomnia, unspecified type  G47.00     4. Smoker  F17.200      Receiving Psychotherapy: No   RECOMMENDATIONS:  PDMP was reviewed.  Last Xanax filled 09/16/2023. I provided 20 minutes of face to face time during this  encounter, including time spent before and after the visit in records review, medical decision making, counseling pertinent to today's visit, and charting.   He's doing well so no changes are needed.   Smoking cessation discussed.   Continue Xanax 0.25 mg, 1/2-1 tid prn.  Continue Zoloft 100 mg, 2 p.o. daily. Continue trazodone 50 mg, 1/2-2 nightly as needed sleep. Return in 2-3 months.  Melony Overly, PA-C

## 2023-11-30 ENCOUNTER — Ambulatory Visit: Payer: 59 | Admitting: Physician Assistant

## 2024-01-02 ENCOUNTER — Encounter: Payer: Self-pay | Admitting: Physician Assistant

## 2024-01-02 ENCOUNTER — Ambulatory Visit: Payer: 59 | Admitting: Physician Assistant

## 2024-01-02 DIAGNOSIS — F4010 Social phobia, unspecified: Secondary | ICD-10-CM | POA: Diagnosis not present

## 2024-01-02 DIAGNOSIS — F3342 Major depressive disorder, recurrent, in full remission: Secondary | ICD-10-CM | POA: Diagnosis not present

## 2024-01-02 DIAGNOSIS — F17200 Nicotine dependence, unspecified, uncomplicated: Secondary | ICD-10-CM | POA: Diagnosis not present

## 2024-01-02 NOTE — Progress Notes (Signed)
 [facility]  Patient ID: John Estes,  MRN: [id number]  PCP: Lacinda Moritz, NP  Date of Evaluation: 01/02/2024 Time spent:20 minutes  Chief Complaint:  Chief Complaint   Anxiety; Depression; Follow-up    HISTORY/CURRENT STATUS: HPI For f/u med check.   Doing well with mental health meds. Patient is able to enjoy things.  Energy and motivation are good.  Work is going well.   No extreme sadness, tearfulness, or feelings of hopelessness.  Sleeps well most of the time.  Uses Trazodone  when needed.  ADLs and personal hygiene are normal.   Denies any changes in concentration, making decisions, or remembering things.  Appetite has not changed.  Weight is stable.  Anxiety is controlled, still uses Xanax  but no more than needed.  Denies suicidal or homicidal thoughts.  Patient denies increased energy with decreased need for sleep, increased talkativeness, racing thoughts, impulsivity or risky behaviors, increased spending, increased libido, grandiosity, increased irritability or anger, paranoia, or hallucinations.  Denies dizziness, syncope, seizures, numbness, tingling, tremor, tics, unsteady gait, slurred speech, confusion. Denies muscle or joint pain, stiffness, or dystonia. Denies unexplained weight loss, frequent infections, or sores that heal slowly.  No polyphagia, polydipsia, or polyuria. Denies visual changes or paresthesias.   Individual Medical History/ Review of Systems: Changes? :No    Past medications for mental health diagnoses include: Prozac , hydroxyzine , Gabapentin after ER visit for alcohol withdrawal.  Unclear how long he took that medication but he stopped it on his own, Trazodone . Xanax  since around 48 yo.  BuSpar  made him feel funny. Propranolol  didn't work. Nicorette gum, Wellbutrin  Allergies: Penicillins  Current Medications:  Current Outpatient Medications:    ALPRAZolam  (XANAX ) 0.25 MG tablet, Take 1 tablet (0.25 mg total) by mouth 3 (three) times  daily as needed for anxiety., Disp: 90 tablet, Rfl: 3   atorvastatin (LIPITOR) 20 MG tablet, Take 20 mg by mouth daily., Disp: , Rfl:    famotidine (PEPCID) 20 MG tablet, Take 20 mg by mouth daily., Disp: , Rfl:    lisinopril (ZESTRIL) 20 MG tablet, Take 20 mg by mouth daily., Disp: , Rfl:    pantoprazole (PROTONIX) 40 MG tablet, Take 40 mg by mouth daily., Disp: , Rfl:    sertraline  (ZOLOFT ) 100 MG tablet, Take 2 tablets (200 mg total) by mouth daily., Disp: 180 tablet, Rfl: 1   traZODone  (DESYREL ) 50 MG tablet, TAKE 1 TO 2 TABLETS(50 TO 100 MG) BY MOUTH AT BEDTIME AS NEEDED FOR SLEEP, Disp: 60 tablet, Rfl: 11 Medication Side Effects: none  Family Medical/ Social History: Changes? His great-uncle died.   MENTAL HEALTH EXAM:  There were no vitals taken for this visit.There is no height or weight on file to calculate BMI.  General Appearance: Casual, Well Groomed, and partially edentulous   Eye Contact:  Good  Speech:  Clear and Coherent and Normal Rate  Volume:  Normal  Mood:  Euthymic  Affect:  Congruent  Thought Process:  Goal Directed and Descriptions of Associations: Circumstantial  Orientation:  Full (Time, Place, and Person)  Thought Content: Logical   Suicidal Thoughts:  No  Homicidal Thoughts:  No  Memory:  WNL  Judgement:  Good  Insight:  Good  Psychomotor Activity:  Normal  Concentration:  Concentration: Good and Attention Span: Good  Recall:  Good  Fund of Knowledge: Good  Language: Good  Assets:  Communication Skills Desire for Improvement Financial Resources/Insurance Housing Resilience Social Support Transportation Vocational/Educational  ADL's:  Intact  Cognition: WNL  Prognosis:  Good   DIAGNOSES:    ICD-10-CM   1. Major depression, recurrent, full remission (HCC)  F33.42     2. Social anxiety disorder  F40.10     3. Smoker  F17.200       Receiving Psychotherapy: No   RECOMMENDATIONS:  PDMP was reviewed.  Last Xanax  filled 12/23/2023. I  provided  20 minutes of face to face time during this encounter, including time spent before and after the visit in records review, medical decision making, counseling pertinent to today's visit, and charting.   Smoking cessation briefly discussed.   Doing well so no changes are needed.   Continue Xanax  0.25 mg, 1/2-1 tid prn.  Continue Zoloft  100 mg, 2 p.o. daily. Continue trazodone  50 mg, 1/2-2 nightly as needed sleep. Return in 3 months.  Verneita Cooks, PA-C

## 2024-02-12 ENCOUNTER — Other Ambulatory Visit: Payer: Self-pay | Admitting: Physician Assistant

## 2024-04-01 ENCOUNTER — Ambulatory Visit (INDEPENDENT_AMBULATORY_CARE_PROVIDER_SITE_OTHER): Payer: 59 | Admitting: Physician Assistant

## 2024-04-24 ENCOUNTER — Other Ambulatory Visit: Payer: Self-pay | Admitting: Physician Assistant

## 2024-05-21 ENCOUNTER — Ambulatory Visit: Admitting: Physician Assistant

## 2024-06-09 ENCOUNTER — Other Ambulatory Visit: Payer: Self-pay | Admitting: Physician Assistant

## 2024-06-16 ENCOUNTER — Ambulatory Visit (INDEPENDENT_AMBULATORY_CARE_PROVIDER_SITE_OTHER): Payer: Self-pay | Admitting: Physician Assistant

## 2024-06-16 ENCOUNTER — Encounter: Payer: Self-pay | Admitting: Physician Assistant

## 2024-06-16 DIAGNOSIS — F4010 Social phobia, unspecified: Secondary | ICD-10-CM | POA: Diagnosis not present

## 2024-06-16 DIAGNOSIS — F17200 Nicotine dependence, unspecified, uncomplicated: Secondary | ICD-10-CM

## 2024-06-16 DIAGNOSIS — F3342 Major depressive disorder, recurrent, in full remission: Secondary | ICD-10-CM | POA: Diagnosis not present

## 2024-06-16 MED ORDER — SERTRALINE HCL 100 MG PO TABS: 200.0000 mg | ORAL_TABLET | Freq: Every day | ORAL | 3 refills | Status: DC

## 2024-06-16 MED ORDER — ALPRAZOLAM 0.25 MG PO TABS: 0.2500 mg | ORAL_TABLET | Freq: Three times a day (TID) | ORAL | 1 refills | Status: DC | PRN

## 2024-06-16 MED ORDER — TRAZODONE HCL 50 MG PO TABS: ORAL_TABLET | ORAL | 3 refills | Status: AC

## 2024-06-16 NOTE — Progress Notes (Signed)
 [facility]  Patient ID: John Estes,  MRN: [id number]  PCP: Lacinda Moritz, NP  Date of Evaluation: 06/16/2024 Time spent:25 minutes  Chief Complaint:  Chief Complaint   Anxiety; Depression; Insomnia; Follow-up    HISTORY/CURRENT STATUS: HPI For f/u med check.   Montrelle is doing well as far as his medications go.  His mood is stable.  He has been under a lot of stress at work but otherwise okay.  Energy and motivation are good.  No extreme sadness, tearfulness, or feelings of hopelessness.  Sleeps well most of the time.  ADLs and personal hygiene are normal.   Denies any changes in concentration, making decisions, or remembering things.  Appetite has not changed.  Weight is stable.   No mania, delirium, AH/VH.  No SI/HI.  Anxiety is well controlled. Takes the Xanax  when gets really overwhelmed but not having PA for the most part.  There are times when he feels like he might get one if he doesn't take the Xanax .   He asks about smoking cessation treatments. He's ready to quit but not sure how/what to do.   Denies dizziness, syncope, seizures, numbness, tingling, tremor, tics, unsteady gait, slurred speech, confusion. Denies muscle or joint pain, stiffness, or dystonia.  Individual Medical History/ Review of Systems: Changes? :No    Past medications for mental health diagnoses include: Prozac , hydroxyzine , Gabapentin after ER visit for alcohol withdrawal.  Unclear how long he took that medication but he stopped it on his own, Trazodone . Xanax  since around 48 yo.  BuSpar  made him feel funny. Propranolol  didn't work. Nicorette gum, Wellbutrin  Allergies: Penicillins  Current Medications:  Current Outpatient Medications:    ALPRAZolam  (XANAX ) 0.25 MG tablet, TAKE 1 TABLET(0.25 MG) BY MOUTH THREE TIMES DAILY AS NEEDED FOR ANXIETY, Disp: 18 tablet, Rfl: 0   atorvastatin (LIPITOR) 20 MG tablet, Take 20 mg by mouth daily., Disp: , Rfl:    clobetasol cream (TEMOVATE) 0.05  %, Apply 1 Application topically., Disp: , Rfl:    famotidine (PEPCID) 20 MG tablet, Take 20 mg by mouth daily., Disp: , Rfl:    lisinopril (ZESTRIL) 20 MG tablet, Take 20 mg by mouth daily., Disp: , Rfl:    pantoprazole (PROTONIX) 40 MG tablet, Take 40 mg by mouth daily., Disp: , Rfl:    ALPRAZolam  (XANAX ) 0.25 MG tablet, Take 1 tablet (0.25 mg total) by mouth 3 (three) times daily as needed for anxiety., Disp: 90 tablet, Rfl: 1   sertraline  (ZOLOFT ) 100 MG tablet, Take 2 tablets (200 mg total) by mouth daily., Disp: 180 tablet, Rfl: 3   traZODone  (DESYREL ) 50 MG tablet, TAKE 1 TO 2 TABLETS(50 TO 100 MG) BY MOUTH AT BEDTIME AS NEEDED FOR SLEEP, Disp: 180 tablet, Rfl: 3 Medication Side Effects: none  Family Medical/ Social History: Changes? no  MENTAL HEALTH EXAM:  There were no vitals taken for this visit.There is no height or weight on file to calculate BMI.  General Appearance: Casual and Well Groomed  Eye Contact:  Good  Speech:  Clear and Coherent and Normal Rate  Volume:  Normal  Mood:  Euthymic  Affect:  Congruent  Thought Process:  Goal Directed and Descriptions of Associations: Circumstantial  Orientation:  Full (Time, Place, and Person)  Thought Content: Logical   Suicidal Thoughts:  No  Homicidal Thoughts:  No  Memory:  WNL  Judgement:  Good  Insight:  Good  Psychomotor Activity:  Normal  Concentration:  Concentration: Good and Attention Span: Good  Recall:  [place] of Knowledge: Good  Language: Good  Assets:  Communication Skills Desire for Improvement Financial Resources/Insurance Housing Resilience Social Support Transportation Vocational/Educational  ADL's:  Intact  Cognition: WNL  Prognosis:  Good   DIAGNOSES:    ICD-10-CM   1. Social anxiety disorder  F40.10     2. Major depression, recurrent, full remission (HCC)  F33.42     3. Smoker  F17.200       Receiving Psychotherapy: No    RECOMMENDATIONS:  PDMP was reviewed.  Last Xanax  filled  04/25/2024. I provided approximately 25 minutes of face to face time during this encounter, including time spent before and after the visit in records review, medical decision making, counseling pertinent to today's visit, and charting.   Approx 17 minutes spent in counseling concerning smoking cessation. Neither of us  like the idea of Chantix  d/t possible SE. Recommend either patches and/or gum/lozenges.  Explained how to best use them for success.  He verbalizes understanding.  Or decreased the number of cigarettes he smokes each day.  Continue Xanax  0.25 mg, 1/2-1 tid prn.  Continue Zoloft  100 mg, 2 p.o. daily. Continue trazodone  50 mg, 1/2-2 nightly as needed sleep. Return in 2 months.  Verneita Cooks, PA-C

## 2024-08-18 ENCOUNTER — Ambulatory Visit (INDEPENDENT_AMBULATORY_CARE_PROVIDER_SITE_OTHER): Admitting: Physician Assistant

## 2024-08-18 DIAGNOSIS — F4323 Adjustment disorder with mixed anxiety and depressed mood: Secondary | ICD-10-CM

## 2024-08-18 DIAGNOSIS — G4700 Insomnia, unspecified: Secondary | ICD-10-CM

## 2024-08-19 ENCOUNTER — Encounter: Payer: Self-pay | Admitting: Physician Assistant

## 2024-08-19 MED ORDER — SERTRALINE HCL 100 MG PO TABS: 250.0000 mg | ORAL_TABLET | Freq: Every day | ORAL | Status: DC

## 2024-08-19 NOTE — Progress Notes (Signed)
 [facility]  Patient ID: John Estes,  MRN: [id number]  PCP: Lacinda Moritz, NP  Date of Evaluation: 08/18/2024 Time spent:20 minutes  Chief Complaint:  Chief Complaint   Anxiety    HISTORY/CURRENT STATUS: HPI Not doing well  Very anxious d/t physical health issues and working a lot. He passed out 8 days ago while sitting on the toilet. States he wasn't straining to have a BM, in fact usually has loose stools. Had no warning signs, just woke up on the floor.  He was alone in a hotel, incident wasn't witnessed, unsure how long he was unconscious. Hit his head.  Didn't seek medical attention until yesterday, was having neck pain and went to the ER. CT of head was neg for acute abnormality. CT C spine showed no fx, but disc space narrowing at C6-7. Given Flexeril.  Hasn't started it yet. He's very concerned about his labs. States WBC was really high, his PCP is sending him to [facility].  Alexxander has needed the Xanax  more d/t all this.  Feels panicky but no full blown PA.    Before all this happened, his mental health was stable except for being really busy at work, he's been overwhelmed. Patient is able to enjoy things.  Energy and motivation are good.   No extreme sadness, tearfulness, or feelings of hopelessness.  Sleeps well most of the time. ADLs and personal hygiene are normal.   Denies any changes in concentration, making decisions, or remembering things.  Appetite has not changed.  No mania, delirium, AH/VH.  No SI/HI.  Review of Systems  Constitutional: Negative.   HENT: Negative.    Eyes: Negative.   Respiratory: Negative.    Cardiovascular: Negative.   Gastrointestinal: Negative.   Genitourinary: Negative.   Musculoskeletal:  Positive for neck pain.  Skin: Negative.   Neurological: Negative.   Endo/Heme/Allergies: Negative.   Psychiatric/Behavioral:         See HPI   Individual Medical History/ Review of Systems: Changes? :Yes   see above.   Past medications for  mental health diagnoses include: Prozac , hydroxyzine , Gabapentin after ER visit for alcohol withdrawal.  Unclear how long he took that medication but he stopped it on his own, Trazodone . Xanax  since around 49 yo.  BuSpar  made him feel funny. Propranolol  didn't work. Nicorette gum, Wellbutrin  Allergies: Penicillins  Current Medications:  Current Outpatient Medications:    ALPRAZolam  (XANAX ) 0.25 MG tablet, Take 1 tablet (0.25 mg total) by mouth 3 (three) times daily as needed for anxiety., Disp: 90 tablet, Rfl: 1   atorvastatin (LIPITOR) 20 MG tablet, Take 20 mg by mouth daily., Disp: , Rfl:    lisinopril (ZESTRIL) 20 MG tablet, Take 20 mg by mouth daily., Disp: , Rfl:    pantoprazole (PROTONIX) 40 MG tablet, Take 40 mg by mouth daily., Disp: , Rfl:    traZODone  (DESYREL ) 50 MG tablet, TAKE 1 TO 2 TABLETS(50 TO 100 MG) BY MOUTH AT BEDTIME AS NEEDED FOR SLEEP, Disp: 180 tablet, Rfl: 3   clobetasol cream (TEMOVATE) 0.05 %, Apply 1 Application topically., Disp: , Rfl:    famotidine (PEPCID) 20 MG tablet, Take 20 mg by mouth daily., Disp: , Rfl:    sertraline  (ZOLOFT ) 100 MG tablet, Take 2.5 tablets (250 mg total) by mouth daily., Disp: , Rfl:  Medication Side Effects: none  Family Medical/ Social History: Changes? no  MENTAL HEALTH EXAM:  There were no vitals taken for this visit.There is no height or weight on file  to calculate BMI.  General Appearance: Casual and Well Groomed  Eye Contact:  Good  Speech:  Clear and Coherent and Normal Rate  Volume:  Normal  Mood:  Euthymic  Affect:  Congruent  Thought Process:  Goal Directed and Descriptions of Associations: Circumstantial  Orientation:  Full (Time, Place, and Person)  Thought Content: Logical   Suicidal Thoughts:  No  Homicidal Thoughts:  No  Memory:  WNL  Judgement:  Good  Insight:  Good  Psychomotor Activity:  Normal  Concentration:  Concentration: Good and Attention Span: Good  Recall:  Good  Fund of Knowledge: Good   Language: Good  Assets:  Communication Skills Desire for Improvement Financial Resources/Insurance Housing Resilience Social Support Transportation Vocational/Educational  ADL's:  Intact  Cognition: WNL  Prognosis:  Good   ER note, labs, imaging results from 08/17/2024 were reviewed. Significant labs to me: WBC 14.6, manual diff reviewed. CMP Glu 169  DIAGNOSES:    ICD-10-CM   1. Situational mixed anxiety and depressive disorder  F43.23     2. Insomnia, unspecified type  G47.00      Receiving Psychotherapy: No    RECOMMENDATIONS:  PDMP was reviewed.  Last Xanax  filled 07/24/2024. I provided approximately 30  minutes of face to face time during this encounter, including time spent before and after the visit in records review, medical decision making, counseling pertinent to today's visit, and charting.   We discussed his WBC, of course he should follow the recommendations of his PCP, but I made him aware that any infection, even a minor as an ingrown toenail can cause increased WBC so try not to worry.   His current anxiety is mostly circumstantial and will improve once the situation is resolved.  But I think increasing the Zoloft  will be beneficial as well. He's been on the same dose for awhile now and it may not be as effective as it has been.   Continue Xanax  0.25 mg, 1/2-1 tid prn.  Increase Zoloft  100 mg,to 2.5 pills p.o. daily. Continue trazodone  50 mg, 1/2-2 nightly as needed sleep. Return in 6-8 weeks.   Verneita Cooks, PA-C

## 2024-08-22 ENCOUNTER — Other Ambulatory Visit: Payer: Self-pay | Admitting: Physician Assistant

## 2024-08-22 DIAGNOSIS — F4323 Adjustment disorder with mixed anxiety and depressed mood: Secondary | ICD-10-CM

## 2024-10-20 ENCOUNTER — Ambulatory Visit: Payer: Self-pay | Admitting: Physician Assistant

## 2024-10-20 DIAGNOSIS — Z91199 Patient's noncompliance with other medical treatment and regimen due to unspecified reason: Secondary | ICD-10-CM

## 2024-10-20 NOTE — Progress Notes (Signed)
 No show

## 2024-11-07 ENCOUNTER — Ambulatory Visit (INDEPENDENT_AMBULATORY_CARE_PROVIDER_SITE_OTHER): Admitting: Physician Assistant

## 2024-11-07 ENCOUNTER — Encounter: Payer: Self-pay | Admitting: Physician Assistant

## 2024-11-07 DIAGNOSIS — F4010 Social phobia, unspecified: Secondary | ICD-10-CM | POA: Diagnosis not present

## 2024-11-07 DIAGNOSIS — F17200 Nicotine dependence, unspecified, uncomplicated: Secondary | ICD-10-CM | POA: Diagnosis not present

## 2024-11-07 DIAGNOSIS — F4323 Adjustment disorder with mixed anxiety and depressed mood: Secondary | ICD-10-CM | POA: Diagnosis not present

## 2024-11-07 MED ORDER — SERTRALINE HCL 100 MG PO TABS: 200.0000 mg | ORAL_TABLET | Freq: Every day | ORAL | 3 refills | Status: AC

## 2024-11-07 MED ORDER — VARENICLINE TARTRATE 1 MG PO TABS: 1.0000 mg | ORAL_TABLET | Freq: Two times a day (BID) | ORAL | 1 refills | Status: AC

## 2024-11-07 MED ORDER — ALPRAZOLAM 0.25 MG PO TABS: 0.2500 mg | ORAL_TABLET | Freq: Three times a day (TID) | ORAL | 1 refills | Status: AC | PRN

## 2024-11-07 MED ORDER — VARENICLINE TARTRATE 0.5 MG PO TABS: 0.5000 mg | ORAL_TABLET | Freq: Two times a day (BID) | ORAL | 0 refills | Status: AC

## 2024-11-07 NOTE — Progress Notes (Signed)
 [facility]  Patient ID: John Estes,  MRN: [id number]  PCP: Lacinda Moritz, NP  Date of Evaluation: 11/07/2024 Time spent:25 minutes  Chief Complaint:  Chief Complaint   Anxiety; Follow-up    HISTORY/CURRENT STATUS: HPI  For routine med check.  Doing really well!  He has flown several times since our last visit.  He had a fear of flying and was anxious about some other things as well so we increased the Zoloft  at the last visit.  However he decided not to increase it, and just see how things went.  He is excited because he was able to do these trips without needing to increase the Xanax  either.  I feel like I have better control of the depression and anxiety and I'm ready to quit smoking.  Has tried the nicotine  gum and Wellbutrin, neither worked.  If appropriate, he would like to try Chantix .   He is able to enjoy things.  Energy and motivation are good.  Work is going well.   No extreme sadness, tearfulness, or feelings of hopelessness.  Sleeps well.  ADLs and personal hygiene are normal.   Denies any changes in concentration, making decisions, or remembering things.  Appetite has not changed.  Weight is stable.  Anxiety is controlled.  He still takes the Xanax  as needed and it is effective.  No mania, delirium, AH/VH.  No SI/HI.  Individual Medical History/ Review of Systems: Changes? 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  Unclear how long he took that medication but he stopped it on his own, Trazodone . Xanax  since around 48 yo.  BuSpar  made him feel funny. Propranolol  didn't work. Nicorette gum, Wellbutrin  Allergies: Penicillins  Current Medications:  Current Outpatient Medications:    atorvastatin (LIPITOR) 20 MG tablet, Take 20 mg by mouth daily., Disp: , Rfl:    famotidine (PEPCID) 20 MG tablet, Take 20 mg by mouth daily., Disp: , Rfl:    lisinopril (ZESTRIL)  20 MG tablet, Take 20 mg by mouth daily., Disp: , Rfl:    pantoprazole (PROTONIX) 40 MG tablet, Take 40 mg by mouth daily., Disp: , Rfl:    traZODone  (DESYREL ) 50 MG tablet, TAKE 1 TO 2 TABLETS(50 TO 100 MG) BY MOUTH AT BEDTIME AS NEEDED FOR SLEEP, Disp: 180 tablet, Rfl: 3   varenicline  (CHANTIX ) 0.5 MG tablet, Take 1 tablet (0.5 mg total) by mouth 2 (two) times daily., Disp: 42 tablet, Rfl: 0   varenicline  (CHANTIX ) 1 MG tablet, Take 1 tablet (1 mg total) by mouth 2 (two) times daily., Disp: 60 tablet, Rfl: 1   ALPRAZolam  (XANAX ) 0.25 MG tablet, Take 1 tablet (0.25 mg total) by mouth 3 (three) times daily as needed for anxiety., Disp: 90 tablet, Rfl: 1   clobetasol cream (TEMOVATE) 0.05 %, Apply 1 Application topically., Disp: , Rfl:    sertraline  (ZOLOFT ) 100 MG tablet, Take 2 tablets (200 mg total) by mouth daily., Disp: 180 tablet, Rfl: 3 Medication Side Effects: none  Family Medical/ Social History: Changes? no  MENTAL HEALTH EXAM:  There were no vitals taken for this visit.There is no height or weight on file to calculate BMI.  General Appearance: Casual, Well Groomed, and partially edentulous  Eye Contact:  Good  Speech:  Clear and Coherent and Normal Rate  Volume:  Normal  Mood:  Euthymic  Affect:  Congruent  Thought Process:  Goal Directed and Descriptions of  Associations: Circumstantial  Orientation:  Full (Time, Place, and Person)  Thought Content: Logical   Suicidal Thoughts:  No  Homicidal Thoughts:  No  Memory:  WNL  Judgement:  Good  Insight:  Good  Psychomotor Activity:  Normal  Concentration:  Concentration: Good and Attention Span: Good  Recall:  Good  Fund of Knowledge: Good  Language: Good  Assets:  Communication Skills Desire for Improvement Financial Resources/Insurance Housing Resilience Social Support Transportation Vocational/Educational  ADL's:  Intact  Cognition: WNL  Prognosis:  Good   DIAGNOSES:    ICD-10-CM   1. Smoker  F17.200     2.  Situational mixed anxiety and depressive disorder  F43.23 ALPRAZolam  (XANAX ) 0.25 MG tablet    3. Social anxiety disorder  F40.10      Receiving Psychotherapy: No    RECOMMENDATIONS:  PDMP was reviewed.  Last Xanax  filled 09/26/2024. I provided approximately  25 minutes of face to face time during this encounter, including time spent before and after the visit in records review, medical decision making, counseling pertinent to today's visit, and charting.   I provided approximately 17 minutes in counseling concerning smoking cessation options.  He plans to try quitting cold turkey and use hard candy, lollipops, or toothpicks to help with the hand to mouth habit.  But he would like a prescription for the Chantix  in case he is not successful.  We discussed the benefits, risks, and all side effects including the possibility of worsening depression and suicidal thoughts.  He knows what to watch for and will stop it immediately and call if there is a problem.  Encouragement given.  He is doing well with his other medications so no changes need to be made. Continue Xanax  0.25 mg, 1/2-1 tid prn.  Continue Zoloft  100 mg, 2 pills p.o. daily. Continue trazodone  50 mg, 1/2-2 nightly as needed sleep. Return in 2 months.   Verneita Cooks, PA-C

## 2025-01-12 ENCOUNTER — Ambulatory Visit (INDEPENDENT_AMBULATORY_CARE_PROVIDER_SITE_OTHER): Admitting: Physician Assistant
